# Patient Record
Sex: Male | Born: 1985 | Race: White | Hispanic: No | Marital: Single | Smoking: Never smoker
Health system: Southern US, Community
[De-identification: ages and names within clinical notes are randomized; demographics above are authoritative.]

## PROBLEM LIST (undated history)

## (undated) DIAGNOSIS — I10 Essential (primary) hypertension: Secondary | ICD-10-CM

---

## 2013-05-02 LAB — CBC
MCV: 89 fL (ref 80–100)
RDW: 13.4 % (ref 11.5–14.5)
WBC: 16.3 10*3/uL — ABNORMAL HIGH (ref 3.8–10.6)

## 2013-05-02 LAB — ACETAMINOPHEN LEVEL: Acetaminophen: <2 ug/mL

## 2013-05-02 LAB — COMPREHENSIVE METABOLIC PANEL
Albumin: 4.4 g/dL (ref 3.4–5.0)
Anion Gap: 4 — ABNORMAL LOW (ref 7–16)
Calcium, Total: 9.8 mg/dL (ref 8.5–10.1)
Chloride: 102 mmol/L (ref 98–107)
EGFR (Non-African Amer.): 56 — ABNORMAL LOW
Glucose: 101 mg/dL — ABNORMAL HIGH (ref 65–99)
Osmolality: 274 (ref 275–301)
Potassium: 4.3 mmol/L (ref 3.5–5.1)
SGOT(AST): 30 U/L (ref 15–37)
SGPT (ALT): 29 U/L (ref 12–78)
Sodium: 135 mmol/L — ABNORMAL LOW (ref 136–145)
Total Protein: 8.4 g/dL — ABNORMAL HIGH (ref 6.4–8.2)

## 2013-05-02 LAB — URINALYSIS, COMPLETE
Bilirubin,UR: NEGATIVE
Glucose,UR: NEGATIVE mg/dL (ref 0–75)
Leukocyte Esterase: NEGATIVE
Nitrite: NEGATIVE
Ph: 5 (ref 4.5–8.0)
Protein: NEGATIVE
RBC,UR: 1 /HPF (ref 0–5)

## 2013-05-02 LAB — DRUG SCREEN, URINE
Amphetamines, Ur Screen: NEGATIVE (ref ?–1000)
Barbiturates, Ur Screen: NEGATIVE (ref ?–200)
Benzodiazepine, Ur Scrn: POSITIVE (ref ?–200)
MDMA (Ecstasy)Ur Screen: NEGATIVE (ref ?–500)
Methadone, Ur Screen: NEGATIVE (ref ?–300)
Opiate, Ur Screen: NEGATIVE (ref ?–300)
Tricyclic, Ur Screen: NEGATIVE (ref ?–1000)

## 2013-05-02 LAB — ETHANOL: Ethanol %: <0.003 % (ref 0.000–0.080)

## 2013-05-02 LAB — SALICYLATE LEVEL: Salicylates, Serum: <1.7 mg/dL

## 2013-05-03 ENCOUNTER — Inpatient Hospital Stay: Payer: Self-pay | Admitting: Psychiatry

## 2013-05-03 LAB — BASIC METABOLIC PANEL
Anion Gap: 7 (ref 7–16)
BUN: 20 mg/dL — ABNORMAL HIGH (ref 7–18)
Calcium, Total: 9.6 mg/dL (ref 8.5–10.1)
Co2: 25 mmol/L (ref 21–32)
Creatinine: 1.24 mg/dL (ref 0.60–1.30)
EGFR (Non-African Amer.): >60
Osmolality: 274 (ref 275–301)
Sodium: 135 mmol/L — ABNORMAL LOW (ref 136–145)

## 2013-05-03 LAB — CBC
MCHC: 32.8 g/dL (ref 32.0–36.0)
Platelet: 285 10*3/uL (ref 150–440)
RBC: 5.46 10*6/uL (ref 4.40–5.90)
RDW: 13 % (ref 11.5–14.5)

## 2013-05-04 LAB — BEHAVIORAL MEDICINE 1 PANEL
Albumin: 4.2 g/dL (ref 3.4–5.0)
Alkaline Phosphatase: 81 U/L
Anion Gap: 5 — ABNORMAL LOW (ref 7–16)
BUN: 16 mg/dL (ref 7–18)
Basophil #: 0.1 10*3/uL (ref 0.0–0.1)
Basophil %: 0.4 %
Bilirubin,Total: 0.8 mg/dL (ref 0.2–1.0)
Calcium, Total: 9.7 mg/dL (ref 8.5–10.1)
Chloride: 101 mmol/L (ref 98–107)
Co2: 27 mmol/L (ref 21–32)
Creatinine: 1.32 mg/dL — ABNORMAL HIGH (ref 0.60–1.30)
EGFR (African American): >60
EGFR (Non-African Amer.): >60
Eosinophil #: 0.1 10*3/uL (ref 0.0–0.7)
Eosinophil %: 0.4 %
Glucose: 101 mg/dL — ABNORMAL HIGH (ref 65–99)
HCT: 49.6 % (ref 40.0–52.0)
HGB: 16.5 g/dL (ref 13.0–18.0)
Lymphocyte #: 1.9 10*3/uL (ref 1.0–3.6)
Lymphocyte %: 12.5 %
MCH: 29.7 pg (ref 26.0–34.0)
MCHC: 33.3 g/dL (ref 32.0–36.0)
MCV: 89 fL (ref 80–100)
Monocyte #: 1 x10 3/mm (ref 0.2–1.0)
Monocyte %: 6.4 %
Neutrophil #: 12 10*3/uL — ABNORMAL HIGH (ref 1.4–6.5)
Neutrophil %: 80.3 %
Osmolality: 268 (ref 275–301)
Platelet: 338 10*3/uL (ref 150–440)
Potassium: 3.9 mmol/L (ref 3.5–5.1)
RBC: 5.54 10*6/uL (ref 4.40–5.90)
RDW: 12.9 % (ref 11.5–14.5)
SGOT(AST): 17 U/L (ref 15–37)
SGPT (ALT): 27 U/L (ref 12–78)
Sodium: 133 mmol/L — ABNORMAL LOW (ref 136–145)
Thyroid Stimulating Horm: 2.22 u[IU]/mL
Total Protein: 8.4 g/dL — ABNORMAL HIGH (ref 6.4–8.2)
WBC: 15 10*3/uL — ABNORMAL HIGH (ref 3.8–10.6)

## 2013-05-06 LAB — URINALYSIS, COMPLETE
Bacteria: NONE SEEN
Bilirubin,UR: NEGATIVE
Glucose,UR: NEGATIVE mg/dL (ref 0–75)
Ketone: NEGATIVE
Leukocyte Esterase: NEGATIVE
Nitrite: NEGATIVE
Ph: 6 (ref 4.5–8.0)
Protein: NEGATIVE
RBC,UR: 1 /HPF (ref 0–5)
Specific Gravity: 1.016 (ref 1.003–1.030)
Squamous Epithelial: NONE SEEN
WBC UR: 1 /HPF (ref 0–5)

## 2014-09-06 NOTE — Consult Note (Signed)
PATIENT NAME:  Brent Rios, [name] MR#:  [id number] DATE OF BIRTH:  February 27, 1986  DATE OF CONSULTATION:  05/03/2013  REFERRING PHYSICIAN:  Chaney Mallingavid Yao, MD CONSULTING PHYSICIAN:  Ardeen FillersUzma S. Garnetta BuddyFaheem, MD  REASON FOR CONSULTATION: "I am having suicidal thoughts."   HISTORY OF PRESENT ILLNESS: The patient is a 29 year old white male who was admitted to the ED after he started having suicidal thoughts. He reported that he also quit his job and cannot communicate. During my interview, the patient was sitting quietly on the bed, and he reported that he lost motivation to do things and was pulling away from his spiritual congregation. He reported that he is not too far away to be helped. He stated that he is just ashamed of himself as he started viewing pornography off and on for the past 10 years. He stated that he did not get help in time as he has been watching pornography on the TV, Internet, and on his cell phone. He is currently part of [employer]. He stated that he tried to seek help before, but was never able to conquer his problems. He started watching porn at the age of 29. It go so much that he was out of his spiritual congregation. He recently told his parents and his sister. They were trying to get help for him so he can get through his problems. His father brought him here as he told his mother yesterday that he is going to kill himself. A few days ago he started having suicidal thoughts to escape from this issue. He reported that he was planning to either ingest antifreeze or to put a screwdriver into his head. He reported that his sister gave him some Xanax and as he was unable to sleep. He reported that he stopped eating approximately 2 weeks ago and started losing weight. He has lost significant amount of weight. He was not sleeping as well. He reported that his 2 brothers also have history of anxiety. The patient also lost his job and has been sitting at home and was feeling guilty about the same.   PAST  PSYCHIATRIC HISTORY: The patient reported that he was tried on Celexa in the past, but he only took it for a few days. He is feeling guilty at this time and is having suicidal thoughts.   FAMILY HISTORY: The patient reported his brothers have panic attacks, but they do not take any medications. His parents are [employer].  SOCIAL HISTORY: He lives with his family including his brothers, sister, and parents. He recently quit his job.  He has never married and does not have any children.   ALLERGIES: No known drug allergies.   CURRENT MEDICATIONS: None. No legal pending charges.   VITAL SIGNS: Temperature 98.1, pulse 98, respirations 18, blood pressure 159/96.  LABORATORY DATA: Glucose 127, BUN 20, creatinine 1.24, sodium 135, potassium 3.9, chloride 103, bicarbonate 25, anion gap 7, osmolality 274, calcium 9.6, protein 8.4, albumin 4.4, bilirubin 0.4, alkaline phosphatase 84, AST 30, ALT 29. UDS positive for benzodiazepines. WBC 12, RBC 5.46, hemoglobin 16.1, hematocrit 49.1, MCV 90, and RDW 13.   MENTAL STATUS EXAMINATION: The patient is a moderately built male who appeared apprehensive and anxious during the interview. He maintained poor eye contact. His speech was low in tone and volume. Mood was depressed and anxious. Affect was congruent. Thought process was circumstantial. Thought content was nondelusional. He admits to feeling depressed. He admits to having suicidal ideations with a plan. He was unable to  contract for safety at this time.   DIAGNOSTIC IMPRESSION: AXIS I: 1.  Major depressive disorder, recurrent, severe, without psychotic features.  2.  Anxiety disorder, not otherwise specified.  AXIS II: None.  AXIS III: None.   TREATMENT PLAN:  1.  The patient is currently on involuntary commitment and will be admitted to the inpatient behavioral health unit for stabilization and safety.  2.  He will be started on medications to control his anxiety including Lexapro 10 mg in  the morning and trazodone 100 mg at bedtime. He will be monitored closely by the behavioral health staff and will be placed on suicide precautions. Will obtain collateral information from his family at this time.   Thank you for allowing me to participate in the care of this patient.   ____________________________ Ardeen Fillers. Garnetta Buddy, MD usf:sb D: 05/03/2013 14:30:34 ET T: 05/03/2013 14:47:09 ET JOB#: [id number]  cc: Ardeen Fillers. Garnetta Buddy, MD, <Dictator> Rhunette Croft MD ELECTRONICALLY SIGNED 05/08/2013 14:38
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
## 2014-10-03 ENCOUNTER — Emergency Department (HOSPITAL_COMMUNITY)
Admission: EM | Admit: 2014-10-03 | Discharge: 2014-10-03 | Disposition: A | Discharge status: Other Acute Inpatient Hospital | Payer: Self-pay | Attending: Emergency Medicine | Admitting: Emergency Medicine

## 2014-10-03 ENCOUNTER — Inpatient Hospital Stay (HOSPITAL_COMMUNITY)
Admission: AD | Admit: 2014-10-03 | Discharge: 2014-10-10 | DRG: 885 | Disposition: A | Discharge status: Home / Self Care | Payer: Federal, State, Local not specified - Other | Source: Intra-hospital | Attending: Psychiatry | Admitting: Psychiatry

## 2014-10-03 ENCOUNTER — Encounter (HOSPITAL_COMMUNITY): Payer: Self-pay | Admitting: Emergency Medicine

## 2014-10-03 DIAGNOSIS — F332 Major depressive disorder, recurrent severe without psychotic features: Principal | ICD-10-CM | POA: Diagnosis present

## 2014-10-03 DIAGNOSIS — F411 Generalized anxiety disorder: Secondary | ICD-10-CM | POA: Diagnosis not present

## 2014-10-03 DIAGNOSIS — F419 Anxiety disorder, unspecified: Secondary | ICD-10-CM | POA: Diagnosis present

## 2014-10-03 DIAGNOSIS — R45851 Suicidal ideations: Secondary | ICD-10-CM | POA: Diagnosis present

## 2014-10-03 DIAGNOSIS — G4700 Insomnia, unspecified: Secondary | ICD-10-CM | POA: Diagnosis present

## 2014-10-03 DIAGNOSIS — F329 Major depressive disorder, single episode, unspecified: Secondary | ICD-10-CM | POA: Insufficient documentation

## 2014-10-03 DIAGNOSIS — Z79899 Other long term (current) drug therapy: Secondary | ICD-10-CM | POA: Insufficient documentation

## 2014-10-03 DIAGNOSIS — I10 Essential (primary) hypertension: Secondary | ICD-10-CM | POA: Diagnosis present

## 2014-10-03 HISTORY — DX: Essential (primary) hypertension: I10

## 2014-10-03 LAB — COMPREHENSIVE METABOLIC PANEL
ALK PHOS: 61 U/L (ref 38–126)
ALT: 53 U/L (ref 17–63)
AST: 35 U/L (ref 15–41)
Albumin: 4.6 g/dL (ref 3.5–5.0)
Anion gap: 10 (ref 5–15)
BUN: 16 mg/dL (ref 6–20)
CHLORIDE: 107 mmol/L (ref 101–111)
CO2: 24 mmol/L (ref 22–32)
Calcium: 9.7 mg/dL (ref 8.9–10.3)
Creatinine, Ser: 0.95 mg/dL (ref 0.61–1.24)
GFR calc non Af Amer: >60 mL/min (ref >60)
GLUCOSE: 88 mg/dL (ref 65–99)
Potassium: 3.9 mmol/L (ref 3.5–5.1)
Sodium: 141 mmol/L (ref 135–145)
TOTAL PROTEIN: 8 g/dL (ref 6.5–8.1)
Total Bilirubin: 0.8 mg/dL (ref 0.3–1.2)

## 2014-10-03 LAB — CBC
HCT: 46.7 % (ref 39.0–52.0)
Hemoglobin: 15.6 g/dL (ref 13.0–17.0)
MCH: 30.2 pg (ref 26.0–34.0)
MCHC: 33.4 g/dL (ref 30.0–36.0)
MCV: 90.3 fL (ref 78.0–100.0)
PLATELETS: 336 10*3/uL (ref 150–400)
RBC: 5.17 MIL/uL (ref 4.22–5.81)
RDW: 13.1 % (ref 11.5–15.5)
WBC: 15.5 10*3/uL — AB (ref 4.0–10.5)

## 2014-10-03 LAB — ETHANOL

## 2014-10-03 LAB — RAPID URINE DRUG SCREEN, HOSP PERFORMED
Amphetamines: NOT DETECTED
BENZODIAZEPINES: NOT DETECTED
Barbiturates: NOT DETECTED
Cocaine: NOT DETECTED
Opiates: NOT DETECTED
TETRAHYDROCANNABINOL: NOT DETECTED

## 2014-10-03 LAB — SALICYLATE LEVEL

## 2014-10-03 LAB — ACETAMINOPHEN LEVEL: Acetaminophen (Tylenol), Serum: <10 ug/mL — ABNORMAL LOW (ref 10–30)

## 2014-10-03 NOTE — ED Notes (Signed)
Bed: WA28 Expected date:  Expected time:  Means of arrival:  Comments: Triage 4  

## 2014-10-03 NOTE — ED Provider Notes (Signed)
CSN: [id number]     Arrival date & time 10/03/14  1728 History   First MD Initiated Contact with Patient 10/03/14 1920     Chief Complaint  Patient presents with  . Suicidal     (Consider location/radiation/quality/duration/timing/severity/associated sxs/prior Treatment) HPI Mr. Brent Rios is a 29 y.o male with a history of SI and depression who presents for SI with a plan to hang himself with a belt after arguing with his sister and parents. He states he just had a moment today where he could take it and he trashed his bedroom and left the house. 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 He states he has had SI in the past. He is currently on prozac and states he saw his psych doctor last month.  He denies any HI towards family members or anyone else.  He denies hallucinations.  He denies any alcohol or drug use. He denies any pain.  He denies any fever, chest pain, shortness of breath, nausea, vomiting, diarrhea, or urinary symptoms.  Past Medical History  Diagnosis Date  . Hypertension    History reviewed. No pertinent past surgical history. History reviewed. No pertinent family history. History  Substance Use Topics  . Smoking status: Never Smoker   . Smokeless tobacco: Not on file  . Alcohol Use: Yes     Comment: socially    Review of Systems  All other systems reviewed and are negative.     Allergies  Pollen extract  Home Medications   Prior to Admission medications   Medication Sig Start Date End Date Taking? Authorizing Provider  acetaminophen (TYLENOL) 500 MG tablet Take 1,000 mg by mouth every 6 (six) hours as needed for moderate pain or headache.   Yes Historical Provider, MD  diphenhydrAMINE (BENADRYL) 25 MG tablet Take 25 mg by mouth every 6 (six) hours as needed for allergies.   Yes Historical Provider, MD  FLUoxetine (PROZAC) 40 MG capsule Take 40 mg by mouth daily.   Yes Historical Provider, MD  ibuprofen  (ADVIL,MOTRIN) 200 MG tablet Take 200 mg by mouth every 6 (six) hours as needed for fever or moderate pain.   Yes Historical Provider, MD   BP 150/96 mmHg  Pulse 79  Temp(Src) 98.2 F (36.8 C) (Oral)  Resp 18  SpO2 99% Physical Exam  Constitutional: He is oriented to person, place, and time. He appears well-developed and well-nourished.  HENT:  Head: Normocephalic and atraumatic.  Eyes: Conjunctivae are normal.  Neck: Normal range of motion. Neck supple.  Cardiovascular: Normal rate, regular rhythm and normal heart sounds.   Pulmonary/Chest: Effort normal and breath sounds normal.  Abdominal: Soft. There is no tenderness.  Musculoskeletal: Normal range of motion.  Neurological: He is alert and oriented to person, place, and time.  Skin: Skin is warm and dry.  Psychiatric: He has a normal mood and affect. His speech is normal and behavior is normal.  Nursing note and vitals reviewed.   ED Course  Procedures (including critical care time) Labs Review Labs Reviewed  ACETAMINOPHEN LEVEL - Abnormal; Notable for the following:    Acetaminophen (Tylenol), Serum <10 (*)    All other components within normal limits  CBC - Abnormal; Notable for the following:    WBC 15.5 (*)    All other components within normal limits  COMPREHENSIVE METABOLIC PANEL  ETHANOL  SALICYLATE LEVEL  URINE RAPID DRUG SCREEN (HOSP PERFORMED)  Imaging Review No results found.   EKG Interpretation None      MDM   Final diagnoses:  Suicidal ideation  Patient presents for SI with plan to hang himself with a belt. He states he left the house after trashing his room and did not attempt to go through with his plan.  He denies HI, hallucinations, or drug and alcohol use. He has been seen for this is the past.  He has no pain or complains now. His vitals and labs are unremarkable.  He is medically cleared for treatment.   There is a Comptrollersitter at bedside.      Catha GosselinHanna Patel-Mills, PA-C 10/04/14  1258  Richardean Canalavid H Yao, MD 10/05/14 [phone]

## 2014-10-03 NOTE — Progress Notes (Signed)
[facility], report has been called to the receiving RN, transportation has been paged. Awaiting for pt to be transported to [facility]---------Braeson Rupe, rn

## 2014-10-03 NOTE — BH Assessment (Signed)
Assessment Note  Brent HorsfallSeth Rios is an 29 y.o. male. Who states he presents to [facility] because he was about to hang himself today. The pt stated " I freaked out at home today and I tore up my room. I sat down on the bed and was about to hang myself."  Patient states that he cannot state what specifically triggered him. Patient states that he did not go through with the act of suicide because he has a lot of friends who knows of suicidal people Patient states that he did not want to be a further burden. Therefore, he decided not to commit suicide.    Patient informed CSW that he currently lives in [place] with his mom, dad, and sister. He states that he does not like living at home. He states that his living situation is a big stressor for him. Patient informed CSW that he feels as though he has been having a hard time living with his parents for a few years and that he cannot put up with it any longer.   The pt stated " I dont feel like I get the freedom and respect I deserve." Patient stated to CSW that he feels "smothered". Also, pt states that his brother and sister in law are having marital issues and will be getting a separation. The pt states that this is also a stressor for him because he is close with the two.   Patient admits that he was feeling suicidal today. He states that he is not feeling suicidal at the moment, but says that he does not feel safe going back home. He states it is because he does not know what he will do once he gets home and also because he does not want to go home with his parents.  The pt denies HI and AH/VH. The pt states that he used a belt when he was about to hang himself today. However, he states that he does not have any other weapons at home. 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  The pt states that he was scheduled to have an appointment with his therapist tonight Marvis Repress(Harry Scruggs). He states that he [facility]  [facility]. The pt states that he takes prescribed Prozac.  The pt states that he has insomnia and has been having issues sleeping for about 3 weeks.  CSW spoke with NP regarding disposition: The pt is currently meeting criteria for inpatient placement.  CSW spoke with AC/Joanne who states that the [facility] and his bed number is [id number]. The accepting physician is Dr. Jama Flavorsobos.  Nurse Report Number : [id number]    Axis I: Depressive Disorder NOS Axis II: Deferred Axis III: History reviewed. No pertinent past medical history. Axis IV: housing problems Axis V: 51-60 moderate symptoms  Past Medical History: History reviewed. No pertinent past medical history.  History reviewed. No pertinent past surgical history.  Family History: History reviewed. No pertinent family history.  Social History:  reports that he has never smoked. He does not have any smokeless tobacco history on file. He reports that he drinks alcohol. He reports that he does not use illicit drugs.  Additional Social History:     CIWA: CIWA-Ar BP: 136/91 mmHg Pulse Rate: 113 COWS:    Allergies: No Known Allergies  Home Medications:  (Not in a hospital admission)  OB/GYN Status:  No  LMP for male patient.  General Assessment Data Location of Assessment: [facility] ED Is this a Tele or Face-to-Face Assessment?: Face-to-Face Is this an Initial Assessment or a Re-assessment for this encounter?: Initial Assessment Marital status: Single Is patient pregnant?:  (Male.) Living Arrangements: Parent Can pt return to current living arrangement?: Yes (Pt currently lives at home w/his parents which is a stressor) Admission Status: Voluntary Is patient capable of signing voluntary admission?: Yes Referral Source: Self/Family/Friend     Crisis Care Plan Living Arrangements: Parent  Education Status Is patient currently in school?: No  Risk to self with the past 6  months Suicidal Ideation: Yes-Currently Present Has patient been a risk to self within the past 6 months prior to admission? : Yes Suicidal Intent: Yes-Currently Present Has patient had any suicidal intent within the past 6 months prior to admission? :  (Pt states he was about to hang himself today.) Is patient at risk for suicide?: Yes (Pt states that he does not feel safe returning home.) Suicidal Plan?: Yes-Currently Present Has patient had any suicidal plan within the past 6 months prior to admission? : Yes Specify Current Suicidal Plan:  (Pt states that he was about to hang himself earlier today.) Access to Means: No (Pt used a belt today for indicent. He states no other weapon) What has been your use of drugs/alcohol within the last 12 months?:  (The pt is currently negative in the chart for drugs.) Previous Attempts/Gestures:  (Uknown.) Intentional Self Injurious Behavior: None Family Suicide History: Unknown Recent stressful life event(s): Other (Comment) (Living Situation) Persecutory voices/beliefs?: No Depression: Yes Depression Symptoms: Insomnia Substance abuse history and/or treatment for substance abuse?:  (Uknown) Suicide prevention information given to non-admitted patients: Not applicable (The pt will be going to the observation unit.)  Risk to Others within the past 6 months Homicidal Ideation: No Does patient have any lifetime risk of violence toward others beyond the six months prior to admission? : Unknown Thoughts of Harm to Others: No Current Homicidal Intent: No Current Homicidal Plan: No Access to Homicidal Means: No Identified Victim:  (None. Pt denies being HI.) History of harm to others?: No Assessment of Violence: None Noted Does patient have access to weapons?: No (However, the pt does have a belt.) Criminal Charges Pending?: No Does patient have a court date: No Is patient on probation?: No  Psychosis Hallucinations: None noted Delusions: None  noted  Mental Status Report Appearance/Hygiene: In scrubs Eye Contact: Good Motor Activity: Freedom of movement Speech: Logical/coherent Level of Consciousness: Alert Mood: Other (Comment) (Appropriate.) Affect: Appropriate to circumstance Anxiety Level: Minimal Thought Processes: Coherent, Relevant Judgement: Unimpaired Orientation: Person, Place, Time, Situation, Appropriate for developmental age Obsessive Compulsive Thoughts/Behaviors: None  Cognitive Functioning Concentration: Normal Memory: Recent Intact, Remote Intact IQ: Average Insight: Good Impulse Control: Good Appetite: Good Sleep: Decreased (Pt states that he has had insomnia for about 3 weeks.) Vegetative Symptoms:  (Uknown.)  ADLScreening [facility]) Patient's cognitive ability adequate to safely complete daily activities?: Yes Patient able to express need for assistance with ADLs?: Yes Independently performs ADLs?: Yes (appropriate for developmental age)  Prior Inpatient Therapy Prior Inpatient Therapy: Yes [facility]  Prior Outpatient Therapy Prior Outpatient Therapy: Yes Prior Therapy Facilty/Provider(s):  (Pt informed CSW that his therapist is Marvis RepressHarry Scruggs.) Does patient have an ACCT team?: No Does patient have Intensive In-House Services?  : No Does patient have [facility] services? : No Does patient have P4CC services?: No  ADL Screening (condition at time of admission) Patient's cognitive ability adequate to safely complete daily activities?: Yes Patient able to express need for assistance with ADLs?: Yes Independently performs ADLs?: Yes (appropriate for developmental age)             Advance Directives (For Healthcare) Does patient have an advance directive?: No    Additional Information 1:1 In Past 12 Months?: No CIRT Risk: No Elopement Risk: No Does patient have medical clearance?: No     Disposition:   Disposition Initial Assessment Completed for this Encounter: Yes Disposition of Patient: Inpatient treatment program Type of inpatient treatment program: Adult (The pt will be admitted to [facility].)  On Site Evaluation by:   Reviewed with Physician:    Crista Curb R 10/03/2014 9:05 PM

## 2014-10-03 NOTE — ED Notes (Signed)
Pt states he is suicidal from family stressors.  Pt states that he had a belt around his neck and was going to fasten it to something but decided not to. Denies any HI.  Denies substance abuse.

## 2014-10-04 ENCOUNTER — Encounter (HOSPITAL_COMMUNITY): Payer: Self-pay | Admitting: Behavioral Health

## 2014-10-04 DIAGNOSIS — F332 Major depressive disorder, recurrent severe without psychotic features: Secondary | ICD-10-CM | POA: Diagnosis present

## 2014-10-04 DIAGNOSIS — F411 Generalized anxiety disorder: Secondary | ICD-10-CM

## 2014-10-04 MED ORDER — TRAZODONE HCL 50 MG PO TABS
50.0000 mg | ORAL_TABLET | Freq: Every evening | ORAL | Status: DC | PRN
  Administered 2014-10-04 – 2014-10-09 (×4): 50 mg via ORAL
  Filled 2014-10-04 (×2): qty 1
  Filled 2014-10-04: qty 28
  Filled 2014-10-04 (×3): qty 1

## 2014-10-04 MED ORDER — METOPROLOL TARTRATE 25 MG PO TABS
12.5000 mg | ORAL_TABLET | Freq: Two times a day (BID) | ORAL | Status: DC
  Administered 2014-10-05: 12.5 mg via ORAL
  Filled 2014-10-04: qty 1
  Filled 2014-10-04 (×2): qty 0.5
  Filled 2014-10-04: qty 1

## 2014-10-04 MED ORDER — CLONIDINE HCL 0.1 MG PO TABS
0.1000 mg | ORAL_TABLET | Freq: Once | ORAL | Status: AC
  Administered 2014-10-04: 0.1 mg via ORAL
  Filled 2014-10-04: qty 1

## 2014-10-04 MED ORDER — ALUM & MAG HYDROXIDE-SIMETH 200-200-20 MG/5ML PO SUSP: 30.0000 mL | ORAL | Status: DC | PRN

## 2014-10-04 MED ORDER — IBUPROFEN 600 MG PO TABS
600.0000 mg | ORAL_TABLET | Freq: Four times a day (QID) | ORAL | Status: DC | PRN
  Administered 2014-10-04 – 2014-10-07 (×3): 600 mg via ORAL
  Filled 2014-10-04 (×3): qty 1

## 2014-10-04 MED ORDER — MAGNESIUM HYDROXIDE 400 MG/5ML PO SUSP: 30.0000 mL | Freq: Every day | ORAL | Status: DC | PRN

## 2014-10-04 MED ORDER — LOPERAMIDE HCL 2 MG PO CAPS
2.0000 mg | ORAL_CAPSULE | ORAL | Status: DC | PRN
  Administered 2014-10-04: 2 mg via ORAL
  Filled 2014-10-04 (×2): qty 1

## 2014-10-04 MED ORDER — FLUOXETINE HCL 20 MG PO CAPS
40.0000 mg | ORAL_CAPSULE | Freq: Every day | ORAL | Status: DC
  Administered 2014-10-04: 40 mg via ORAL
  Filled 2014-10-04 (×3): qty 2

## 2014-10-04 MED ORDER — ACETAMINOPHEN 325 MG PO TABS
650.0000 mg | ORAL_TABLET | Freq: Four times a day (QID) | ORAL | Status: DC | PRN
  Administered 2014-10-04 – 2014-10-07 (×5): 650 mg via ORAL
  Filled 2014-10-04 (×5): qty 2

## 2014-10-04 MED ORDER — CITALOPRAM HYDROBROMIDE 20 MG PO TABS
20.0000 mg | ORAL_TABLET | Freq: Every day | ORAL | Status: DC
  Administered 2014-10-05 – 2014-10-10 (×6): 20 mg via ORAL
  Filled 2014-10-04 (×5): qty 1
  Filled 2014-10-04: qty 14
  Filled 2014-10-04 (×2): qty 1

## 2014-10-04 NOTE — H&P (Addendum)
Psychiatric Admission Assessment Adult  Patient Identification: Brent Rios MRN:  [id number] Date of Evaluation:  10/04/2014 Chief Complaint:  DEPRESSIVE DISODER,NOS Principal Diagnosis: Major depressive disorder, recurrent episode, severe Diagnosis:   Patient Active Problem List   Diagnosis Date Noted  . Major depressive disorder, recurrent episode, severe [F33.2] 10/04/2014   History of Present Illness::  Patient is a 29 year old man, who lives with parents and sister. He states he has been feeling more depressed and also experiencing a lot of anxiety. He attributes this to family stressors. These stressors include his parents' health gradually deteriorating, his sister having a serious medical condition ( autoimmune disease ) for which he has taken the role of being her " caregiver", taking her to and from appointments, helping her with daily activities, and more recently his brother separating. Patient states that these stressors have become severely overwhelming for him, and he has developed depression, anxiety, and increased suicidal ideations. States he has a long history of thinking of suicide whenever he feels significantly stressed, and yesterday was thinking of hanging self , but states he refrained " because I did not want it to appear as if it was a case of erotic auto-asphyxiation and humiliate my family".  Due to severe subjective sense of anxiety, he recently left home impulsively and " just walked", eventually requesting to be brought to hospital. Another contributing factor to depression is that he had been off Prozac for several weeks, months, and restarted it only 3 weeks ago or so, but states " it does not seem to be working this time around ".  Elements:   Gradually worsening anxiety and depression , with recent onset of suicidal ideations, in the context of severe psychosocial/family stressors .  Associated Signs/Symptoms: Depression Symptoms:  depressed  mood, anhedonia, insomnia, suicidal thoughts with specific plan, disturbed sleep, erratic appetite, weight stable (Hypo) Manic Symptoms:   Does not currently endorse  Anxiety Symptoms:  Describes severe anxiety, worry  Psychotic Symptoms:  does not endorse and does not appear internally preoccupied  PTSD Symptoms: Does not endorse  Total Time spent with patient: 45 minutes   Past Psychiatric History- Patient states he has had prior episodes of Depression, Anxiety, and frequent suicidal ideations when he feels anxious, depressed, dating back to adolescence. He has had several psychiatric admissions in the past, last one December/14. He states he has attempted suicide several times in the past . Denies history of self cutting. Denies any clear history of mania or hypomania, describes brief mood swings, but emphasizes depression and anxiety as major symptoms. Denies history of psychosis. Denies social phobia, but is " phobic of public bathrooms" leading to being unable to use bathrooms in certain public venues . Had been on Prozac for several months, and states " seemed to work", but was then off for several months after his psychiatrist moved . He restarted Prozac a few weeks ago but does not think it is helping at present .  Past Medical History:  States he was told in the past he could have cutaneous lupus, but states it was never formally diagnosed . Does not smoke . Past Medical History  Diagnosis Date  . Hypertension    History reviewed. No pertinent past surgical history. Family History:  As noted, lives with parents and sister, identifies their medical health/illnesses as a stressor.  Sister and Mother have history of depression and anxiety, and cousin and a nephew have history of Bipolar Disorder. No suicides in family. He states  family member has responded well to Celexa in the past .  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 Denies  legal issues. Financial difficulties are another stressor. Plays guitar and has been wanting to take further music education.  History  Alcohol Use  . Yes    Comment: socially     History  Drug Use No    History   Social History  . Marital Status: Single    Spouse Name: N/A  . Number of Children: N/A  . Years of Education: N/A   Social History Main Topics  . Smoking status: Never Smoker   . Smokeless tobacco: Not on file  . Alcohol Use: Yes     Comment: socially  . Drug Use: No  . Sexual Activity: No   Other Topics Concern  . None   Social History Narrative   Additional Social History:    Pain Medications: see pta med list History of alcohol / drug use?: No history of alcohol / drug abuse   Musculoskeletal: Strength & Muscle Tone: within normal limits Gait & Station: normal Patient leans: N/A  Psychiatric Specialty Exam: Physical Exam  Review of Systems  Constitutional: Negative.   HENT: Negative.   Eyes: Negative.   Respiratory: Negative.   Cardiovascular: Positive for palpitations.       When anxious    Gastrointestinal: Negative.   Genitourinary:       Urinary obstruction when having access only to public restrooms, but states thus far this has not been a problem in hospital setting   Musculoskeletal: Negative.   Skin: Negative.   Neurological: Negative.   Endo/Heme/Allergies: Negative.   Psychiatric/Behavioral: Positive for depression and suicidal ideas. The patient is nervous/anxious.   all other systems negative   Blood pressure 150/100, pulse 87, temperature 97.6 F (36.4 C), temperature source Oral, resp. rate 16, height $RemoveBe'5\' 8"'llduKenoN$  (1.727 m), weight 288 lb (130.636 kg).Body mass index is 43.8 kg/(m^2).  General Appearance: Fairly Groomed  Engineer, water::  Good  Speech:  Normal Rate  Volume:  Normal  Mood:  Anxious and Depressed  Affect:  reactive, smiles appropriately, presents anxious   Thought Process:  Goal Directed and Linear  Orientation:   Full (Time, Place, and Person)  Thought Content:  ruminative about family stressors, denies hallucinations, no delusions  Suicidal Thoughts:  Yes.  without intent/plan- denies any current plan or intention of hurting self on unit and contracts for safety on unit   Homicidal Thoughts:  No  Memory:  Recent and Remote grossly intact  Judgement:  Good  Insight:  Good  Psychomotor Activity:  Normal  Concentration:  Good  Recall:  Good  Fund of Knowledge:Good  Language: Good  Akathisia:  Negative  Handed:  Right  AIMS (if indicated):     Assets:  Desire for Improvement Housing Resilience Vocational/Educational  ADL's:  Impaired  Cognition: WNL  Sleep:  Number of Hours: 4.25   Risk to Self: Is patient at risk for suicide?: Yes What has been your use of drugs/alcohol within the last 12 months?: Patient reports drinking alcohol weekly Risk to Others:   Prior Inpatient Therapy:   Prior Outpatient Therapy:    Alcohol Screening: 1. How often do you have a drink containing alcohol?: Monthly or less 2. How many drinks containing alcohol do you have on a typical day when you are drinking?: 1 or 2 3. How often do you have six or more drinks on  one occasion?: Never Preliminary Score: 0 9. Have you or someone else been injured as a result of your drinking?: No 10. Has a relative or friend or a doctor or another health worker been concerned about your drinking or suggested you cut down?: No Alcohol Use Disorder Identification Test Final Score (AUDIT): 1 Brief Intervention: AUDIT score less than 7 or less-screening does not suggest unhealthy drinking-brief intervention not indicated  Allergies:   Allergies  Allergen Reactions  . Pollen Extract    Lab Results:  Results for orders placed or performed during the hospital encounter of 10/03/14 (from the past 48 hour(s))  Acetaminophen level     Status: Abnormal   Collection Time: 10/03/14  6:29 PM  Result Value Ref Range   Acetaminophen  (Tylenol), Serum <10 (L) 10 - 30 ug/mL    Comment:        THERAPEUTIC CONCENTRATIONS VARY SIGNIFICANTLY. A RANGE OF 10-30 ug/mL MAY BE AN EFFECTIVE CONCENTRATION FOR MANY PATIENTS. HOWEVER, SOME ARE BEST TREATED AT CONCENTRATIONS OUTSIDE THIS RANGE. ACETAMINOPHEN CONCENTRATIONS >150 ug/mL AT 4 HOURS AFTER INGESTION AND >50 ug/mL AT 12 HOURS AFTER INGESTION ARE OFTEN ASSOCIATED WITH TOXIC REACTIONS.   CBC     Status: Abnormal   Collection Time: 10/03/14  6:29 PM  Result Value Ref Range   WBC 15.5 (H) 4.0 - 10.5 K/uL   RBC 5.17 4.22 - 5.81 MIL/uL   Hemoglobin 15.6 13.0 - 17.0 g/dL   HCT 46.7 39.0 - 52.0 %   MCV 90.3 78.0 - 100.0 fL   MCH 30.2 26.0 - 34.0 pg   MCHC 33.4 30.0 - 36.0 g/dL   RDW 13.1 11.5 - 15.5 %   Platelets 336 150 - 400 K/uL  Comprehensive metabolic panel     Status: None   Collection Time: 10/03/14  6:29 PM  Result Value Ref Range   Sodium 141 135 - 145 mmol/L   Potassium 3.9 3.5 - 5.1 mmol/L   Chloride 107 101 - 111 mmol/L   CO2 24 22 - 32 mmol/L   Glucose, Bld 88 65 - 99 mg/dL   BUN 16 6 - 20 mg/dL   Creatinine, Ser 0.95 0.61 - 1.24 mg/dL   Calcium 9.7 8.9 - 10.3 mg/dL   Total Protein 8.0 6.5 - 8.1 g/dL   Albumin 4.6 3.5 - 5.0 g/dL   AST 35 15 - 41 U/L   ALT 53 17 - 63 U/L   Alkaline Phosphatase 61 38 - 126 U/L   Total Bilirubin 0.8 0.3 - 1.2 mg/dL   GFR calc non Af Amer >60 >60 mL/min   GFR calc Af Amer >60 >60 mL/min    Comment: (NOTE) The eGFR has been calculated using the CKD EPI equation. This calculation has not been validated in all clinical situations. eGFR's persistently <60 mL/min signify possible Chronic Kidney Disease.    Anion gap 10 5 - 15  Ethanol (ETOH)     Status: None   Collection Time: 10/03/14  6:29 PM  Result Value Ref Range   Alcohol, Ethyl (B) <5 <5 mg/dL    Comment:        LOWEST DETECTABLE LIMIT FOR SERUM ALCOHOL IS 11 mg/dL FOR MEDICAL PURPOSES ONLY   Salicylate level     Status: None   Collection Time:  10/03/14  6:29 PM  Result Value Ref Range   Salicylate Lvl <4.3 2.8 - 30.0 mg/dL  Urine Drug Screen     Status: None   Collection Time: 10/03/14  7:41 PM  Result Value Ref Range   Opiates NONE DETECTED NONE DETECTED   Cocaine NONE DETECTED NONE DETECTED   Benzodiazepines NONE DETECTED NONE DETECTED   Amphetamines NONE DETECTED NONE DETECTED   Tetrahydrocannabinol NONE DETECTED NONE DETECTED   Barbiturates NONE DETECTED NONE DETECTED    Comment:        DRUG SCREEN FOR MEDICAL PURPOSES ONLY.  IF CONFIRMATION IS NEEDED FOR ANY PURPOSE, NOTIFY LAB WITHIN 5 DAYS.        LOWEST DETECTABLE LIMITS FOR URINE DRUG SCREEN Drug Class       Cutoff (ng/mL) Amphetamine      1000 Barbiturate      200 Benzodiazepine   856 Tricyclics       314 Opiates          300 Cocaine          300 THC              50    Current Medications: Current Facility-Administered Medications  Medication Dose Route Frequency Provider Last Rate Last Dose  . acetaminophen (TYLENOL) tablet 650 mg  650 mg Oral Q6H PRN Harriet Butte, NP   650 mg at 10/04/14 0818  . alum & mag hydroxide-simeth (MAALOX/MYLANTA) 200-200-20 MG/5ML suspension 30 mL  30 mL Oral Q4H PRN Harriet Butte, NP      . FLUoxetine (PROZAC) capsule 40 mg  40 mg Oral Daily Harriet Butte, NP   40 mg at 10/04/14 0818  . ibuprofen (ADVIL,MOTRIN) tablet 600 mg  600 mg Oral Q6H PRN Jenne Campus, MD   600 mg at 10/04/14 1436  . loperamide (IMODIUM) capsule 2 mg  2 mg Oral PRN Jenne Campus, MD   2 mg at 10/04/14 1436  . magnesium hydroxide (MILK OF MAGNESIA) suspension 30 mL  30 mL Oral Daily PRN Harriet Butte, NP      . traZODone (DESYREL) tablet 50 mg  50 mg Oral QHS PRN Harriet Butte, NP       PTA Medications: Prescriptions prior to admission  Medication Sig Dispense Refill Last Dose  . FLUoxetine (PROZAC) 40 MG capsule Take 40 mg by mouth daily.   Past Week at Unknown time  . ibuprofen (ADVIL,MOTRIN) 200 MG tablet Take 200 mg by mouth  every 6 (six) hours as needed for fever or moderate pain.   Past Month at Unknown time  . acetaminophen (TYLENOL) 500 MG tablet Take 1,000 mg by mouth every 6 (six) hours as needed for moderate pain or headache.   Unknown at Unknown time  . diphenhydrAMINE (BENADRYL) 25 MG tablet Take 25 mg by mouth every 6 (six) hours as needed for allergies.   unknown    Previous Psychotropic Medications:  Yes- prozac   Substance Abuse History in the last 12 months:  No. denies drug or alcohol abuse     Consequences of Substance Abuse: Denies   Results for orders placed or performed during the hospital encounter of 10/03/14 (from the past 72 hour(s))  Acetaminophen level     Status: Abnormal   Collection Time: 10/03/14  6:29 PM  Result Value Ref Range   Acetaminophen (Tylenol), Serum <10 (L) 10 - 30 ug/mL    Comment:        THERAPEUTIC CONCENTRATIONS VARY SIGNIFICANTLY. A RANGE OF 10-30 ug/mL MAY BE AN EFFECTIVE CONCENTRATION FOR MANY PATIENTS. HOWEVER, SOME ARE BEST TREATED AT CONCENTRATIONS OUTSIDE THIS RANGE. ACETAMINOPHEN CONCENTRATIONS >150 ug/mL AT 4 HOURS AFTER  INGESTION AND >50 ug/mL AT 12 HOURS AFTER INGESTION ARE OFTEN ASSOCIATED WITH TOXIC REACTIONS.   CBC     Status: Abnormal   Collection Time: 10/03/14  6:29 PM  Result Value Ref Range   WBC 15.5 (H) 4.0 - 10.5 K/uL   RBC 5.17 4.22 - 5.81 MIL/uL   Hemoglobin 15.6 13.0 - 17.0 g/dL   HCT 46.7 39.0 - 52.0 %   MCV 90.3 78.0 - 100.0 fL   MCH 30.2 26.0 - 34.0 pg   MCHC 33.4 30.0 - 36.0 g/dL   RDW 13.1 11.5 - 15.5 %   Platelets 336 150 - 400 K/uL  Comprehensive metabolic panel     Status: None   Collection Time: 10/03/14  6:29 PM  Result Value Ref Range   Sodium 141 135 - 145 mmol/L   Potassium 3.9 3.5 - 5.1 mmol/L   Chloride 107 101 - 111 mmol/L   CO2 24 22 - 32 mmol/L   Glucose, Bld 88 65 - 99 mg/dL   BUN 16 6 - 20 mg/dL   Creatinine, Ser 0.95 0.61 - 1.24 mg/dL   Calcium 9.7 8.9 - 10.3 mg/dL   Total Protein 8.0 6.5 -  8.1 g/dL   Albumin 4.6 3.5 - 5.0 g/dL   AST 35 15 - 41 U/L   ALT 53 17 - 63 U/L   Alkaline Phosphatase 61 38 - 126 U/L   Total Bilirubin 0.8 0.3 - 1.2 mg/dL   GFR calc non Af Amer >60 >60 mL/min   GFR calc Af Amer >60 >60 mL/min    Comment: (NOTE) The eGFR has been calculated using the CKD EPI equation. This calculation has not been validated in all clinical situations. eGFR's persistently <60 mL/min signify possible Chronic Kidney Disease.    Anion gap 10 5 - 15  Ethanol (ETOH)     Status: None   Collection Time: 10/03/14  6:29 PM  Result Value Ref Range   Alcohol, Ethyl (B) <5 <5 mg/dL    Comment:        LOWEST DETECTABLE LIMIT FOR SERUM ALCOHOL IS 11 mg/dL FOR MEDICAL PURPOSES ONLY   Salicylate level     Status: None   Collection Time: 10/03/14  6:29 PM  Result Value Ref Range   Salicylate Lvl <2.6 2.8 - 30.0 mg/dL  Urine Drug Screen     Status: None   Collection Time: 10/03/14  7:41 PM  Result Value Ref Range   Opiates NONE DETECTED NONE DETECTED   Cocaine NONE DETECTED NONE DETECTED   Benzodiazepines NONE DETECTED NONE DETECTED   Amphetamines NONE DETECTED NONE DETECTED   Tetrahydrocannabinol NONE DETECTED NONE DETECTED   Barbiturates NONE DETECTED NONE DETECTED    Comment:        DRUG SCREEN FOR MEDICAL PURPOSES ONLY.  IF CONFIRMATION IS NEEDED FOR ANY PURPOSE, NOTIFY LAB WITHIN 5 DAYS.        LOWEST DETECTABLE LIMITS FOR URINE DRUG SCREEN Drug Class       Cutoff (ng/mL) Amphetamine      1000 Barbiturate      200 Benzodiazepine   948 Tricyclics       546 Opiates          300 Cocaine          300 THC              50     Observation Level/Precautions:  15 minute checks  Laboratory:  Will obtain TSH- states hypothyroidism "  runs in my family"  Psychotherapy:  Supportive, milieu  Medications:  Agrees to [employer] trial- prefers to try new medication than to continue Prozac, which he states no longer working for him. TRAZODONE for insomnia. Side effects  reviewed . Patient's BP is elevated- states it tends to be elevated "at times", and has been on meds for elevated BP in the past, but has not been formally diagnosed with HTN. Will start low dose Lopressor and monitor.   Consultations:  If needed   Discharge Concerns:  Patient states he is reluctant to return home after discharge as he stats it is difficult for him to deal with the stress there .  Estimated LOS: 5- 6 days   Other:     Psychological Evaluations: No   Treatment Plan Summary: Daily contact with patient to assess and evaluate symptoms and progress in treatment, Medication management, Plan inpatient treatment and medications as above   Medical Decision Making:  Review of Psycho-Social Stressors (1), Review or order clinical lab tests (1), Established Problem, Worsening (2) and Review of New Medication or Change in Dosage (2)  I certify that inpatient services furnished can reasonably be expected to improve the patient's condition.   Neita Garnet 5/20/20163:41 PM

## 2014-10-04 NOTE — Tx Team (Signed)
Interdisciplinary Treatment Plan Update (Adult)  Date:  10/04/2014  Time Reviewed:  8:43 AM   Progress in Treatment: Attending groups: Patient is attending groups. Participating in groups:  Patient engages in discussion Taking medication as prescribed:  Patient is taking medications Tolerating medication:  Patient is tolerating medications Family/Significant othe contact made:   No, will asked for consent to make collateral contact Patient understands diagnosis:Yes, patient understands diagnosis and need for treatment Discussing patient identified problems/goals with staff:  Yes, patient is able to express goals/problems Medical problems stabilized or resolved:  Yes Denies suicidal/homicidal ideation: Yes, patient is denying SI/HI. Issues/concerns per patient self-inventory:   Other:   Discharge Plan or Barriers:  Patient will return home and follow up with outpatient providers  Reason for Continuation of Hospitalization: Anxiety Depression Medication stabilization   Comments:  Brent HorsfallSeth Rios is an 29 y.o. male. Who [facility] because he was about to hang himself today. The pt stated " I freaked out at home today and I tore up my room. I sat down on the bed and was about to hang myself." Patient states that he cannot state what specifically triggered him. Patient states that he did not go through with the act of suicide because he has a lot of friends who knows of suicidal people Patient states that he did not want to be a further burden. Therefore, he decided not to commit suicide.    Additional comments:  Patient and CSW reviewed Patient Discharge Process Letter/Patient Involvement Form.  Patient verbalized understanding and signed form.  Patient and CSW also reviewed and identified patient's goals and treatment plan.  Patient verbalized understanding and agreed to plan.  Estimated length of stay: -3-4 days  New goal(s):  Review of initial/current patient goals  per problem list:     Attendees: Patient 10/04/2014 8:43 AM   Family:   10/04/2014 8:43 AM   Physician:  Nehemiah MassedFernando Cobos, MD 10/04/2014 8:43 AM   Nursing:   Haze BoydenAngela Traxler 10/04/2014 8:43 AM   Clinical Social Worker:  Juline PatchQuylle Jaymie Mckiddy, LCSW 10/04/2014 8:43 AM   Clinical Social Worker:  Samuella BruinKristin Drinkard, LCSW-A 10/04/2014 8:43 AM   Case Manager:  Onnie BoerJennifer Clark, RN 10/04/2014 8:43 AM   Other:  [place] CellarLuann Crissman, RN 10/04/2014 8:43 AM  Other:   10/04/2014  8:43 AM   Other:  10/04/2014 8:43 AM   Other:  10/04/2014 8:43 AM   Other:  10/04/2014 8:43 AM   Other:  Chad CordialValerie Enoch, [facility] Transition Team Coordinator 10/04/2014 8:43 AM   Other:   10/04/2014 8:43 AM   Other:  10/04/2014 8:43 AM   Other:   10/04/2014 8:43 AM    Scribe for Treatment Team:   Wynn BankerHodnett, Jahnai Slingerland Hairston, 10/04/2014   8:43 AM

## 2014-10-04 NOTE — Tx Team (Signed)
Initial Interdisciplinary Treatment Plan   PATIENT STRESSORS: Financial difficulties Marital or family conflict Medication change or noncompliance Traumatic event   PATIENT STRENGTHS: Ability for insight Active sense of humor Communication skills General fund of knowledge Motivation for treatment/growth Religious Affiliation Supportive family/friends   PROBLEM LIST: Problem List/Patient Goals Date to be addressed Date deferred Reason deferred Estimated date of resolution  Suicide Risk 10/03/2014     Medication noncompliance 10/03/2014     "I want to focus on myself more." 10/03/2014     "I put the belt around my neck and I thought about tightening it." 10/03/2014     "I am depressed and I have anxiety too." 10/03/2014                              DISCHARGE CRITERIA:  Ability to meet basic life and health needs Adequate post-discharge living arrangements Improved stabilization in mood, thinking, and/or behavior Motivation to continue treatment in a less acute level of care Need for constant or close observation no longer present Reduction of life-threatening or endangering symptoms to within safe limits  PRELIMINARY DISCHARGE PLAN: Attend aftercare/continuing care group Attend PHP/IOP Return to previous living arrangement Return to previous work or school arrangements  PATIENT/FAMIILY INVOLVEMENT: This treatment plan has been presented to and reviewed with the patient, Lutricia HorsfallSeth Hanway.  The patient and family have been given the opportunity to ask questions and make suggestions.  Angeline SlimHill, Ashley M 10/04/2014, 4:24 AM

## 2014-10-04 NOTE — [facility] Group Notes (Signed)
[facility]   10/04/2014 9:39 AM    Participation Quality:  Appropraite  Mood/Affect:  Appropriate  Depression Rating:  5  Anxiety Rating:  7  Thoughts of Suicide:  No  Will you contract for safety?   NA  Current AVH:  No  Plan for Discharge/Comments:  Patient attended discharge planning group and actively participated in group. He advised of being suicidal prior to admission due to family issues.  He reports having outpatient providers and a home.  Suicide prevention education reviewed and SPE document provided.   Transportation Means: Patient has transportation.   Supports:  Patient has a support system.   Brent Rios, [name]

## 2014-10-04 NOTE — Progress Notes (Signed)
Patient c/o diarrhea and stated HA was unrelieved by previous PRN medication.  Will report to provider.

## 2014-10-04 NOTE — [facility] Suicide Risk Assessment (Signed)
[facility] INPATIENT:  Family/Significant Other Suicide Prevention Education  Suicide Prevention Education:  Education Completed; Brent Rios, Sister, [phone]; has been identified by the patient as the family member/significant other with whom the patient will be residing, and identified as the person(s) who will aid the patient in the event of a mental health crisis (suicidal ideations/suicide attempt).  With written consent from the patient, the family member/significant other has been provided the following suicide prevention education, prior to the and/or following the discharge of the patient.  The suicide prevention education provided includes the following:  Suicide risk factors  Suicide prevention and interventions  National Suicide Hotline telephone number  [facility] assessment telephone number  [facility] Assistance 911  [facility] and/or [facility]idential [facility] telephone number  Request made of family/significant other to:  Remove weapons (e.g., guns, rifles, knives), all items previously/currently identified as safety concern.   Sister advised patient does not have access to weapons.    Remove drugs/medications (over-the-counter, prescriptions, illicit drugs), all items previously/currently identified as a safety concern.  The family member/significant other verbalizes understanding of the suicide prevention education information provided.  The family member/significant other agrees to remove the items of safety concern listed above.  Brent Rios, Brent Rios 10/04/2014, 1:07 PM

## 2014-10-04 NOTE — Progress Notes (Signed)
Adult Psychoeducational Group Note  Date:  10/04/2014 Time:  10:42 PM  Group Topic/Focus:  Goals Group:   The focus of this group is to help patients establish daily goals to achieve during treatment and discuss how the patient can incorporate goal setting into their daily lives to aide in recovery.  Participation Level:  Active  Participation Quality:  Appropriate  Affect:  Appropriate  Cognitive:  Appropriate  Insight: Appropriate  Engagement in Group:  Engaged  Modes of Intervention:  Discussion  Additional Comments:  Pt stated that he was happy today because this was the first time in a while he got to open up while he was in group to others he didn't know.  Terie PurserParker, Aneta Hendershott R 10/04/2014, 10:42 PM

## 2014-10-04 NOTE — [facility] Counselor (Signed)
Adult Comprehensive Assessment  Patient ID: Brent HorsfallSeth Rios, male   DOB: 07/31/1985, 29 y.o.   MRN: [id number]  Information Source: Information source: Patient  Current Stressors:  Educational / Learning stressors: None Employment / Job issues: None Family Relationships: Patient advised of several stressors in family due to illness Financial / Lack of resources (include bankruptcy): Struggling financially Housing / Lack of housing: None Physical health (include injuries & life threatening diseases): none Social relationships: None Substance abuse: Patient reports drinking weekly Bereavement / Loss: Aunt died last fall  Living/Environment/Situation:  Living Arrangements: Other relatives Living conditions (as described by patient or guardian): Comfortable How long has patient lived in current situation?: Four years What is atmosphere in current home: Comfortable  Family History:  Marital status: Single Does patient have children?: No  Childhood History:  By whom was/is the patient raised?: Mother Additional childhood history information: Very good chilldhood Description of patient's relationship with caregiver when they were a child: Very good Patient's description of current relationship with people who raised him/her: Good Does patient have siblings?: Yes Number of Siblings: 3 Description of patient's current relationship with siblings: Very close Did patient suffer any verbal/emotional/physical/sexual abuse as a child?: No Did patient suffer from severe childhood neglect?: No Has patient ever been sexually abused/assaulted/raped as an adolescent or adult?: No Was the patient ever a victim of a crime or a disaster?: No Witnessed domestic violence?: No Has patient been effected by domestic violence as an adult?: No  Education:  Highest grade of school patient has completed: Pro[employer], television/film/videoHigh School Currently a student?: No Learning disability?: No  Employment/Work Situation:   Employment  situation: Employed Where is patient 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, water qualitydog sitter How long has patient been employed?: Four months Patient's job has been impacted by current illness: No What is the longest time patient has a held a job?: Four years Where was the patient employed at that time?: [facility] Has patient ever been in the Eli Lilly and Companymilitary?: No Has patient ever served in combat?: No  Financial Resources:   Financial resources: Income from employment Does patient have a representative payee or guardian?: No  Alcohol/Substance Abuse:   What has been your use of drugs/alcohol within the last 12 months?: Patient reports drinking alcohol weekly If attempted suicide, did drugs/alcohol play a role in this?: No Alcohol/Substance Abuse Treatment Hx: Denies past history Has alcohol/substance abuse ever caused legal problems?: No  Social Support System:   Forensic psychologistatient's Community Support System: None Describe Community Support System: N/A Type of faith/religion: Christian How does patient's faith help to cope with current illness?: Prays and reads scriptures  Leisure/Recreation:   Leisure and Hobbies: [place]  Strengths/Needs:   What things does the patient do well?: Capable of independent living  In what areas does patient struggle / problems for patient: Focuses more on others than himself  Discharge Plan:   Does patient have access to transportation?: Yes Will patient be returning to same living situation after discharge?: Yes Currently receiving community mental health services: Yes (From Whom) Vesta Mixer([facility]) If no, would patient like referral for services when discharged?: No Does patient have financial barriers related to discharge medications?: Yes Patient description of barriers related to discharge medications: Limited income and no insurance  Summary/Recommendations:  Brent HorsfallSeth Rios is an 29 y.o. male. [facility] was about to [facility]  himself today. The pt stated " I freaked out at home today and I tore up my room.  I sat down on the bed and was about to hang myself." Patient states that he cannot state what specifically triggered him. Patient states that he did not go through with the act of suicide because he has a lot of friends who knows of suicidal people Patient states that he did not want to be a further burden. Therefore, he decided not to commit suicide.   He will benefit from crisis stabilization, evaluation for medication, psycho-education groups for coping skills development, group therapy and case management for discharge planning.   [name], [name]. 10/04/2014

## 2014-10-04 NOTE — [facility] Suicide Risk Assessment (Signed)
[facility]   Nursing information obtained from:  Patient Demographic factors:  Male, Caucasian Current Mental Status:  Self-harm thoughts, Self-harm behaviors, Suicidal ideation indicated by patient Loss Factors:  Financial problems / change in socioeconomic status Historical Factors:  Prior suicide attempts, Family history of mental illness or substance abuse Risk Reduction Factors:  Positive social support, Sense of responsibility to family, Religious beliefs about death, Employed, Living with another person, especially a relative Total Time spent with patient: 45 minutes Principal Problem: Major depressive disorder, recurrent episode, severe Diagnosis:   Patient Active Problem List   Diagnosis Date Noted  . Major depressive disorder, recurrent episode, severe [F33.2] 10/04/2014     Continued Clinical Symptoms:  Alcohol Use Disorder Identification Test Final Score (AUDIT): 1 The "Alcohol Use Disorders Identification Test", Guidelines for Use in Primary Care, Second Edition.  World Science writerHealth Organization [facility](WHO). Score between 0-7:  no or low risk or alcohol related problems. Score between 8-15:  moderate risk of alcohol related problems. Score between 16-19:  high risk of alcohol related problems. Score 20 or above:  warrants further diagnostic evaluation for alcohol dependence and treatment.   CLINICAL FACTORS:  29 year old single male, lives with parents and sister. History of Depression, Anxiety, and prior suicidal ideations. Reports worsening depression and anxiety in the context of family stressors ( parents' declining health, sister has chronic autoimmune illness, brother recently separated). Recent suicidal thoughts of hanging self . Presents depressed, anxious, but able to contract for safety on the unit at this time.   Musculoskeletal: Strength & Muscle Tone: within normal limits Gait & Station: normal Patient leans: N/A  Psychiatric Specialty  Exam: Physical Exam  ROS  Blood pressure 150/100, pulse 87, temperature 97.6 F (36.4 C), temperature source Oral, resp. rate 16, height 5\' 8"  (1.727 m), weight 288 lb (130.636 kg).Body mass index is 43.8 kg/(m^2).  SEE ADMIT NOTE MSE   COGNITIVE FEATURES THAT CONTRIBUTE TO RISK:  Closed-mindedness    SUICIDE RISK:   Moderate:  Frequent suicidal ideation with limited intensity, and duration, some specificity in terms of plans, no associated intent, good self-control, limited dysphoria/symptomatology, some risk factors present, and identifiable protective factors, including available and accessible social support.  PLAN OF CARE: Patient will be admitted to inpatient psychiatric unit for stabilization and safety. Will provide and encourage milieu participation. Provide medication management and maked adjustments as needed.  Will follow daily.    Medical Decision Making:  Review of Psycho-Social Stressors (1), Review or order clinical lab tests (1), Established Problem, Worsening (2) and Review of New Medication or Change in Dosage (2)  I certify that inpatient services furnished can reasonably be expected to improve the patient's condition.   Brent Rios, Brent Rios 10/04/2014, 4:18 PM

## 2014-10-04 NOTE — Plan of Care (Signed)
Problem: Alteration in mood; excessive anxiety as evidenced by: Goal: STG-Pt can identify coping skills to manage panic/anxiety (Patient can identify at least ____ coping skills to manage panic/anxiety attack)  Outcome: Progressing Group attendance with insight.

## 2014-10-04 NOTE — [facility] Suicide Risk Assessment (Signed)
[facility] INPATIENT:  Family/Significant Other Suicide Prevention Education  Suicide Prevention Education:  Contact Attempts:  Brent LeechLucretia Rios, Sister, [phone];  has been identified by the patient as the family member/significant other with whom the patient will be residing, and identified as the person(s) who will aid the patient in the event of a mental health crisis.  With written consent from the patient, two attempts were made to provide suicide prevention education, prior to and/or following the patient's discharge.  We were unsuccessful in providing suicide prevention education.  A suicide education pamphlet was given to the patient to share with family/significant other.  Date and time of first attempt:  Oct 04, 2014 at 12:45 PM.  Message left for sister to call regarding discharging planning. Date and time of second attempt:  Brent Rios, Brent Rios 10/04/2014, 12:40 PM

## 2014-10-04 NOTE — Progress Notes (Signed)
Recreation Therapy Notes  Date: 10/04/14 Time: 9:30am Location: [address] Dayroom  Group Topic: Stress Management  Goal Area(s) Addresses:  Patient will verbalize importance of using healthy stress management.  Patient will identify positive emotions associated with healthy stress management.   Behavioral Response: Attentive, engaged  Intervention: Guided Imagery Script  Activity :  Patients will listen to LRT read the Guided Imagery Script and follow the prompts to become more relaxed.  Education:  Stress Management, Discharge Planning.   Education Outcome: Acknowledges edcuation/In group clarification offered  Clinical Observations/Feedback: Patient gave full participation.  Patient stated he was feeling anxious and thought he was about to have a panic attack and couldn't focus on what he wanted to ask his social worker about.  Patient stated that the activity helped him calm down, get his thoughts together and focus on what he wanted to talk to his social worker about.  Caroll RancherMarjette Delaina Fetsch, LRT/CTRS   Caroll RancherLindsay, Tyrique Sporn A 10/04/2014 4:36 PM
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
## 2014-10-04 NOTE — [facility] Group Notes (Signed)
[facility] LCSW Group Therapy  Feelings Around Relapse 1:15 -2:30        10/04/2014 3:34 PM   Type of Therapy:  Group Therapy  Participation Level:  Appropriate  Participation Quality:  Appropriate  Affect:  Appropriate  Cognitive:  Attentive Appropriate  Insight:  Developing/Improving  Engagement in Therapy: Developing/Improving  Modes of Intervention:  Discussion Exploration Problem-Solving Supportive  Summary of Progress/Problems:  The topic for today was feelings around relapse. Patient processed feelings toward relapse and was able to relate to peers. He shared his relapse would be pornography.  He shared he has many negative habits and doing one leads to others.  Patient identified coping skills that can be used to prevent a relapse clearing his home of porn material and taking a walk when he feels the urges to look at it.   Wynn BankerHodnett, Denney Shein Hairston 10/04/2014 3:34 PM

## 2014-10-04 NOTE — Progress Notes (Signed)
D-Patient is out in the milieu interacting with peers and attending groups.  He states to this Clinical research associatewriter that "I need to be in the hospital." Reports he has been off of his anti depressant. Denies active suicidal thoughts presently and contracts for safety.  A-Support and encouragement offered.  Continue POC and evaluation of treatment goals.  15' checks for safety.  R- Safety maintained.

## 2014-10-05 LAB — TSH: TSH: 1.488 u[IU]/mL (ref 0.350–4.500)

## 2014-10-05 MED ORDER — DIPHENHYDRAMINE HCL 25 MG PO CAPS
25.0000 mg | ORAL_CAPSULE | Freq: Once | ORAL | Status: AC
  Administered 2014-10-05: 25 mg via ORAL
  Filled 2014-10-05: qty 1

## 2014-10-05 MED ORDER — DIPHENHYDRAMINE HCL 25 MG PO CAPS
ORAL_CAPSULE | ORAL | Status: AC
  Filled 2014-10-05: qty 1

## 2014-10-05 MED ORDER — METOPROLOL TARTRATE 12.5 MG HALF TABLET
12.5000 mg | ORAL_TABLET | Freq: Once | ORAL | Status: AC
  Administered 2014-10-05: 12.5 mg via ORAL
  Filled 2014-10-05: qty 1

## 2014-10-05 MED ORDER — METOPROLOL TARTRATE 25 MG PO TABS
25.0000 mg | ORAL_TABLET | Freq: Two times a day (BID) | ORAL | Status: DC
  Administered 2014-10-05 – 2014-10-08 (×6): 25 mg via ORAL
  Filled 2014-10-05 (×9): qty 1

## 2014-10-05 NOTE — Progress Notes (Signed)
Adult Psychoeducational Group Note  Date:  10/05/2014 Time: 10:00am  Group Topic/Focus:  Making Healthy Choices:   The focus of this group is to help patients identify negative/unhealthy choices they were using prior to admission and identify positive/healthier coping strategies to replace them upon discharge.  Participation Level:  Active  Participation Quality:  Appropriate and Attentive  Affect:  Anxious  Cognitive:  Alert and Appropriate  Insight: Improving  Engagement in Group:  Engaged  Modes of Intervention:  Activity, Discussion, Education, Exploration and Support  Additional Comments:  Pt able to identify three healthy coping skills/choices to make in efforts to prevent future mental health crisis.  [name], Syndi Pua E 10/05/2014, 5:34 PM

## 2014-10-05 NOTE — Progress Notes (Signed)
The focus of this group is to help patients review their daily goal of treatment and discuss progress on daily workbooks. Pt attended the evening group session and responded to all discussion prompts from the Writer. Pt shared that today was a good day on the unit, noting that there were several good groups held today. "I was actually able to share stuff that I've never shared before and that felt good." Pt was given encouragement by the Writer and his peers for being open and forthcoming in his groups. Pt reported having no additional needs from Nursing Staff this evening. Pt's affect was appropriate.

## 2014-10-05 NOTE — Progress Notes (Signed)
D: Pt has anxious affect and mood.  Pt reports he feels "a lot better.  I feel like myself for the first time in a while."  Pt reports his goal today was to "listen and communicate."  Pt reports he is happy because "I've never been able to open up like I did today in group.  Usually I just listen."  Pt reports the highlight of his day was "talking to the doctor."  Pt denies SI/HI, denies hallucinations.  Pt reported passive thoughts of self-harm after evening group.  He denied having a plan and verbally contracted for safety.  Pt has been visible in milieu interacting with peers and staff appropriately.  Pt attended evening group.   A: Introduced self to pt.  Met with pt 1:1 and provided support and encouragement.  PRN medication administered for sleep and pain.  Pt continued to have difficulty sleeping and refused to take repeat dose of Trazodone, stating that the first dose was not helpful.  He reported to Probation officer that he has tried multiple medication for sleep and that he has "struggled with insomnia my whole life."  Pt reported to Probation officer that Benadryl is usually effective for him for sleep.  On-call provider notified and Benadryl 25 mg POX1 ordered and administered.   R: Pt is compliant with medications.  Pt verbally contracts for safety and reports that he will notify staff of needs and concerns.  Will continue to monitor and assess.
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
## 2014-10-05 NOTE — Progress Notes (Signed)
[facility] MD Progress Note  10/05/2014 10:18 AM Brent Rios  MRN:  [id number] Subjective: Awsome! Everything has been Awsome!  I'm really getting a great deal out of the groups because I am more open to what people are saying to me, I have been closed off to this in the past. 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 I've been in a number of these types of places and this really is the best!   Objective:  Met with the patient who is brightly affected, up and active on the unit. 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 He is pleasant and cooperative, but appears almost hypomanic today.  Principal Problem: Major depressive disorder, recurrent episode, severe Diagnosis:   Patient Active Problem List   Diagnosis Date Noted  . Major depressive disorder, recurrent episode, severe [F33.2] 10/04/2014   Total Time spent with patient: 30 minutes   Past Medical History:  Past Medical History  Diagnosis Date  . Hypertension    History reviewed. No pertinent past surgical history. Family History: History reviewed. No pertinent family history. Social History:  History  Alcohol Use  . Yes    Comment: socially     History  Drug Use No    History   Social History  . Marital Status: Single    Spouse Name: N/A  . Number of Children: N/A  . Years of Education: N/A   Social History Main Topics  . Smoking status: Never Smoker   . Smokeless tobacco: Not on file  . Alcohol Use: Yes     Comment: socially  . Drug Use: No  . Sexual Activity: No   Other Topics Concern  . None   Social History Narrative   Additional History:    Sleep: Fair  Appetite:   Fair   Assessment:   Musculoskeletal: Strength & Muscle Tone: within normal limits Gait & Station: normal Patient leans: N/A   Psychiatric Specialty Exam: Physical Exam  Vitals reviewed.   Review of Systems  Gastrointestinal: Positive for diarrhea.  Neurological: Positive for headaches.  All other systems reviewed and are negative.   Blood pressure 140/103, pulse 75, temperature 97.6 F (36.4 C), temperature source Oral, resp. rate 20, height $RemoveBe'5\' 8"'AXkzngfjw$  (1.727 m), weight 130.636 kg (288 lb).Body mass index is 43.8 kg/(m^2).  General Appearance: Fairly Groomed  Engineer, water::  Good  Speech:  pressured  Volume:  Normal  Mood:  Joyful   Affect:  Congruent  Thought Process:  circumstantial  Orientation:  Full (Time, Place, and Person)  Thought Content:  Rumination  Suicidal Thoughts:  No  Homicidal Thoughts:  No  Memory:  Immediate;   Good Recent;   Good Remote;   Good  Judgement:  NA  Insight:  Good  Psychomotor Activity:  Normal  Concentration:  Good  Recall:  Good  Fund of Knowledge:Good  Language: Good  Akathisia:  Negative  Handed:  Right  AIMS (if indicated):     Assets:  Resilience  ADL's:  Intact  Cognition: WNL  Sleep:  "really well"     Current Medications: Current Facility-Administered Medications  Medication Dose Route Frequency Provider Last Rate Last  Dose  . acetaminophen (TYLENOL) tablet 650 mg  650 mg Oral Q6H PRN Harriet Butte, NP   650 mg at 10/05/14 0101  . alum & mag hydroxide-simeth (MAALOX/MYLANTA) 200-200-20 MG/5ML suspension 30 mL  30 mL Oral Q4H PRN Harriet Butte, NP      . citalopram (CELEXA) tablet 20 mg  20 mg Oral Daily Jenne Campus, MD   20 mg at 10/05/14 0744  . ibuprofen (ADVIL,MOTRIN) tablet 600 mg  600 mg Oral Q6H PRN Jenne Campus, MD   600 mg at 10/04/14 1436  . loperamide (IMODIUM) capsule 2 mg  2 mg Oral PRN Jenne Campus, MD   2 mg at 10/04/14 1436  . magnesium hydroxide (MILK OF MAGNESIA) suspension 30 mL  30 mL  Oral Daily PRN Harriet Butte, NP      . metoprolol tartrate (LOPRESSOR) tablet 25 mg  25 mg Oral BID Kerrie Buffalo, NP      . traZODone (DESYREL) tablet 50 mg  50 mg Oral QHS PRN Harriet Butte, NP   50 mg at 10/04/14 2243    Lab Results:  Results for orders placed or performed during the hospital encounter of 10/03/14 (from the past 48 hour(s))  TSH     Status: None   Collection Time: 10/05/14  6:30 AM  Result Value Ref Range   TSH 1.488 0.350 - 4.500 uIU/mL    Comment: Performed at [facility]    Physical Findings: AIMS: Facial and Oral Movements Muscles of Facial Expression: None, normal Lips and Perioral Area: None, normal Jaw: None, normal Tongue: None, normal,Extremity Movements Upper (arms, wrists, hands, fingers): None, normal Lower (legs, knees, ankles, toes): None, normal, Trunk Movements Neck, shoulders, hips: None, normal, Overall Severity Severity of abnormal movements (highest score from questions above): None, normal Incapacitation due to abnormal movements: None, normal, Dental Status Current problems with teeth and/or dentures?: No Does patient usually wear dentures?: No  CIWA:    COWS:     Treatment Plan Summary: Continue crisis management and mood stabilization. Medication management to re-stabilize current mood symptoms Group counseling sessions for coping skills Medical consults as needed.  Increased Metoprolol 25 mg BID.  Will monitor. Review and reinstate any pertinent home medications for other health problems May consider mood stabilizer if symptoms of hypomania worsen.  Marlane Hatcher. [place] RPAC 3:02 PM 10/06/2014 Medical Decision Making:  Review of Psycho-Social Stressors (1), Discuss test with performing physician (1), Review and summation of old records (2) and Review of Medication Regimen & Side Effects (2)  Addendum: RN notes that the patient seems to be "speeding up" as he stated that he feels different." I will order  Seroquel $RemoveB'100mg'biotyywJ$  at hs. For possible mania. Marlane Hatcher. [place] [facility] 10/06/2014 3:45 PM  I reviewed chart and agreed with the findings and treatment Plan.  Berniece Andreas, MD

## 2014-10-05 NOTE — Progress Notes (Signed)
Adult Psychoeducational Group Note  Date:  10/05/2014 Time: 09:30am  Group Topic/Focus:  Crisis Planning:   The purpose of this group is to help patients create a crisis plan for use upon discharge or in the future, as needed.  Participation Level:  Active  Participation Quality:  Attentive  Affect:  Anxious  Cognitive:  Alert and Oriented  Insight: Improving  Engagement in Group:  Engaged and Improving  Modes of Intervention:  Activity, Confrontation, Discussion, Education and Support  Additional Comments:  Pt able to identify aspect of mental health crisis [facility]   [name], Sharlena Kristensen E 10/05/2014, 5:26 PM

## 2014-10-05 NOTE — [facility] Group Notes (Signed)
[facility] Group Notes:  (Clinical Social Work)  10/05/2014     1:15-2:15PM  Summary of Progress/Problems:   The main focus of today's process group was to learn how to use a decisional balance exercise to move forward in the Stages of Change, which were described and discussed.  Motivational Interviewing and a worksheet were utilized to help patients explore in depth the perceived benefits and costs of unhealthy coping techniques, as well as the  benefits and costs of replacing that with a healthy coping skills.   The patient expressed that their unhealthy coping involves relapsing into any of several bad habits, maybe drinking, pills, other.  His healthy coping mechanism involves talking out his problems.  He was an excellent contributor to group, shared his ideas and gave feedback to other patients with appropriate comments.  Type of Therapy:  Group Therapy - Process   Participation Level:  Active  Participation Quality:  Appropriate, Attentive, Sharing and Supportive  Affect:  Appropriate  Cognitive:  Alert, Appropriate and Oriented  Insight:  Engaged  Engagement in Therapy:  Engaged  Modes of Intervention:  Education, Motivational Interviewing  Ambrose MantleMareida Grossman-Orr, LCSW 10/05/2014, 4:56 PM

## 2014-10-05 NOTE — Plan of Care (Signed)
Problem: Diagnosis: Increased Risk For Suicide Attempt Goal: STG-Patient Will Attend All Groups On The Unit Outcome: Progressing Pt attended evening group on 10/04/14.

## 2014-10-06 LAB — SYPHILIS: RPR W/REFLEX TO RPR TITER AND TREPONEMAL ANTIBODIES, TRADITIONAL SCREENING AND DIAGNOSIS ALGORITHM: RPR Ser Ql: NONREACTIVE

## 2014-10-06 MED ORDER — DIPHENHYDRAMINE HCL 25 MG PO CAPS
25.0000 mg | ORAL_CAPSULE | Freq: Once | ORAL | Status: AC
  Administered 2014-10-06: 25 mg via ORAL
  Filled 2014-10-06 (×2): qty 1

## 2014-10-06 MED ORDER — QUETIAPINE FUMARATE 100 MG PO TABS
100.0000 mg | ORAL_TABLET | Freq: Every day | ORAL | Status: DC
  Administered 2014-10-06: 100 mg via ORAL
  Filled 2014-10-06 (×3): qty 1

## 2014-10-06 NOTE — Plan of Care (Signed)
Problem: Alteration in mood Goal: LTG-Patient reports reduction in suicidal thoughts (Patient reports reduction in suicidal thoughts and is able to verbalize a safety plan for whenever patient is feeling suicidal)  Outcome: Progressing Pt denied SI this shift and verbally contracted for safety.       

## 2014-10-06 NOTE — Progress Notes (Signed)
Psychoeducational Group Note  Date:  10/06/2014 Time:  1015  Group Topic/Focus:  Making Healthy Choices:   The focus of this group is to help patients identify negative/unhealthy choices they were using prior to admission and identify positive/healthier coping strategies to replace them upon discharge.  Participation Level:  Active  Participation Quality:  Appropriate  Affect:  Appropriate  Cognitive:  Oriented  Insight:  Improving  Engagement in Group:  Engaged  Additional Comments:  Pt participated in the group, and was able to identify needs that they have. They also were able to identify areas that they needed to grow in.  Sahana Boyland A 10/06/2014 

## 2014-10-06 NOTE — Progress Notes (Signed)
D) Pt has attended the groups and interacts with his peers appropriately. Pt, when free, has been in the dayroom interacting with his peers and playing cards. Pt states he usually does not interact with other patients when he has been in other facilities in the past. States, "I feel such a connectedness with the other patients here and feel that we are all in the same situation struggling to be well. This is a positive experience for me" Pt upbeat as opposed to yesterday when he was not feeling as positive. Pt also Stated this morning "I do not feel at all like myself. I don't know if it the Celexa that is making me feel this way or the fact I was drinking a lot of caffeine. But I just don't feel like myself at all". Pt rates his depression at a 3, his hopelessness at a 1 and his anxiety at a 5. A) Given support, reassurance and praise along with encouragement. Provided with a 1:1. Encouraged to talk with the providers to see if he might need a medication adjustment R) Pt denies SI and HI.

## 2014-10-06 NOTE — [facility] Group Notes (Signed)
[facility] Group Notes:  (Clinical Social Work)  10/06/2014  1:15-2:15PM  Summary of Progress/Problems:   The main focus of today's process group was to   1)  discuss the importance of adding supports  2)  define health supports versus unhealthy supports  3)  identify the patient's current unhealthy supports and plan how to handle them  4)  Identify the patient's current healthy supports and plan what to add.  An emphasis was placed on using counselor, doctor, therapy groups, 12-step groups, and problem-specific support groups to expand supports.    The patient expressed full comprehension of the concepts presented, and agreed that there is a need to add more supports.  The patient stated he has a large number of positive supports, but has to start being a more positive support for himself and allowing himself to reach out to those supports.  Type of Therapy:  Process Group with Motivational Interviewing  Participation Level:  Active  Participation Quality:  Attentive, Sharing and Supportive  Affect:  Appropriate  Cognitive:  Alert, Appropriate and Oriented  Insight:  Engaged  Engagement in Therapy:  Engaged  Modes of Intervention:   Education, Support and Processing, Activity  Ambrose MantleMareida Grossman-Orr, LCSW 10/06/2014

## 2014-10-06 NOTE — Progress Notes (Signed)
D: Pt has anxious affect and mood.  Pt reports his goal today was "to talk, relax, and smile."  Pt reports he went to groups today and "they were great."  Pt denies SI/HI, denies hallucinations, denies pain.  Pt has been visible in milieu interacting with peers and staff appropriately.  Pt attended evening group.   A:  Met with pt 1:1 and provided support and encouragement.  PO fluids encouraged and provided.  PRN medication administered for sleep.   R: Pt is compliant with medications.  Pt verbally contracts for safety.  Will continue to monitor and assess.

## 2014-10-07 LAB — GC/CHLAMYDIA PROBE AMP (~~LOC~~) NOT AT ARMC
Chlamydia: NEGATIVE
Neisseria Gonorrhea: NEGATIVE

## 2014-10-07 MED ORDER — HYDROXYZINE HCL 25 MG PO TABS
25.0000 mg | ORAL_TABLET | Freq: Four times a day (QID) | ORAL | Status: AC | PRN
  Administered 2014-10-07: 25 mg via ORAL
  Filled 2014-10-07: qty 1

## 2014-10-07 MED ORDER — MIRTAZAPINE 15 MG PO TABS
15.0000 mg | ORAL_TABLET | Freq: Every day | ORAL | Status: DC
  Administered 2014-10-07 – 2014-10-09 (×3): 15 mg via ORAL
  Filled 2014-10-07: qty 14
  Filled 2014-10-07 (×4): qty 1

## 2014-10-07 MED ORDER — GABAPENTIN 100 MG PO CAPS
100.0000 mg | ORAL_CAPSULE | Freq: Three times a day (TID) | ORAL | Status: DC
  Administered 2014-10-07 – 2014-10-08 (×4): 100 mg via ORAL
  Filled 2014-10-07 (×6): qty 1

## 2014-10-07 MED ORDER — LORATADINE 10 MG PO TABS
10.0000 mg | ORAL_TABLET | Freq: Every day | ORAL | Status: DC
  Administered 2014-10-08 – 2014-10-10 (×3): 10 mg via ORAL
  Filled 2014-10-07: qty 1
  Filled 2014-10-07: qty 14
  Filled 2014-10-07 (×3): qty 1

## 2014-10-07 NOTE — Progress Notes (Signed)
D: Pt presents anxious on approach. Pt denied depression and SI/HI during shift assessment. Pt rated depression 1/10. Anxiety 2/10. Pt c/o ongoing headache. Pt reported to another nurse this morning that he was having thoughts to destroy the room but he came to staff instead of acting out on those thoughts. Pt stated that he often have headaches and that triggers his neg thoughts. Pt offered pain meds for headache. Writer spoke with MD in regards to pt anxiety.  Pt hygiene appropriate for situation. Pt compliant with treatment.  A: Medications administered as ordered per MD. Verbal support given. Pt encouraged to attend groups. 15 minute checks performed for safety.  R: Pt receptive to treatment.

## 2014-10-07 NOTE — Progress Notes (Signed)
Patient ID: Brent Rios, male   DOB: 05-28-1985, 29 y.o.   MRN: [id number] [facility] MD Progress Note  10/07/2014 1:55 PM Senay Sistrunk  MRN:  [id number] Subjective:  Patient reports he is feeling more anxious today, and continues to ruminate about family stressors. Today states he has been thinking about his sister's chronic illness, and thinking that she may die in the relative near future. He also states he has been worrying about a niece who has chronic anxiety and depression as well. States " on the one hand I want to go home, but on the other, I don't feel ready yet- I am still too anxious". Of note, patient had initially stated upon admission that he preferred not to go back home after discharge, but today states that he has reconsidered this and states " when I leave I'll go back home- we are very close and I want to see them". Denies medication side effects.   Objective:   I have discussed case with treatment team and have met with patient. As reviewed , patient's presentation has been variable, ranging from stating that he feels much better and " awesome" to describing significant anxiety. Today he states he certainly feels more anxious - he attributes this  In part to group earlier today which focused on discharge planning, and where he " realized I was not ready to go yet". He also states he had been experiencing a headache, which he feels worsened his anxiety. States that headaches/ pain are intermittent but does worsen his anxiety.  He has been going to groups and there have been no disruptive behaviors on unit.  Currently denies any medication side effects. Recently started on Seroquel QHS - we discussed side effect profile. We discussed other options, and patient was amenable to neurontin, to address pain and decrease anxiety, and to remeron, for sleep and to help address depression and anxiety. Patient has a history of having difficulty urinating  In bathrooms outside his home, but states this has  not been an issue here on unit. He responds to support , empathy, review of medications , treatment goals.    Principal Problem: Major depressive disorder, recurrent episode, severe Diagnosis:   Patient Active Problem List   Diagnosis Date Noted  . Major depressive disorder, recurrent episode, severe [F33.2] 10/04/2014   Total Time spent with patient: 30 minutes   Past Medical History:  Past Medical History  Diagnosis Date  . Hypertension    History reviewed. No pertinent past surgical history. Family History: History reviewed. No pertinent family history. Social History:  History  Alcohol Use  . Yes    Comment: socially     History  Drug Use No    History   Social History  . Marital Status: Single    Spouse Name: N/A  . Number of Children: N/A  . Years of Education: N/A   Social History Main Topics  . Smoking status: Never Smoker   . Smokeless tobacco: Not on file  . Alcohol Use: Yes     Comment: socially  . Drug Use: No  . Sexual Activity: No   Other Topics Concern  . None   Social History Narrative   Additional History:    Sleep:  Variable   Appetite:  Good    Assessment:   Musculoskeletal: Strength & Muscle Tone: within normal limits Gait & Station: normal Patient leans: N/A   Psychiatric Specialty Exam: Physical Exam  Vitals reviewed.   ROS- no nausea, no vomiting, (+)  headaches , which are intermittent   Blood pressure 126/95, pulse 68, temperature 97.5 F (36.4 C), temperature source Oral, resp. rate 18, height $RemoveBe'5\' 8"'laGBpoWoa$  (1.727 m), weight 288 lb (130.636 kg).Body mass index is 43.8 kg/(m^2).  General Appearance: improved grooming  Eye Contact::  Good  Speech:  Normal   Volume:  Normal  Mood:   States today " anxious"   Affect:  Congruent  Thought Process:  Linear   Orientation:  Full (Time, Place, and Person)  Thought Content:  Ruminative about family stressors, no hallucinations, no delusions   Suicidal Thoughts:  No- at this time  denies any thoughts of hurting self or anyone else   Homicidal Thoughts:  No  Memory:  Immediate;   Good Recent;   Good Remote;   Good  Judgement:  NA  Insight:  Good  Psychomotor Activity:  Normal  Concentration:  Good  Recall:  Good  Fund of Knowledge:Good  Language: Good  Akathisia:  Negative  Handed:  Right  AIMS (if indicated):     Assets:  Resilience  ADL's:  Intact  Cognition: WNL  Sleep:  "really well"     Current Medications: Current Facility-Administered Medications  Medication Dose Route Frequency Provider Last Rate Last Dose  . acetaminophen (TYLENOL) tablet 650 mg  650 mg Oral Q6H PRN Harriet Butte, NP   650 mg at 10/07/14 0759  . alum & mag hydroxide-simeth (MAALOX/MYLANTA) 200-200-20 MG/5ML suspension 30 mL  30 mL Oral Q4H PRN Harriet Butte, NP      . citalopram (CELEXA) tablet 20 mg  20 mg Oral Daily Jenne Campus, MD   20 mg at 10/07/14 0759  . gabapentin (NEURONTIN) capsule 100 mg  100 mg Oral TID Jenne Campus, MD      . ibuprofen (ADVIL,MOTRIN) tablet 600 mg  600 mg Oral Q6H PRN Jenne Campus, MD   600 mg at 10/05/14 1208  . loperamide (IMODIUM) capsule 2 mg  2 mg Oral PRN Jenne Campus, MD   2 mg at 10/04/14 1436  . magnesium hydroxide (MILK OF MAGNESIA) suspension 30 mL  30 mL Oral Daily PRN Harriet Butte, NP      . metoprolol tartrate (LOPRESSOR) tablet 25 mg  25 mg Oral BID Kerrie Buffalo, NP   25 mg at 10/07/14 0759  . mirtazapine (REMERON) tablet 15 mg  15 mg Oral QHS Jenne Campus, MD      . traZODone (DESYREL) tablet 50 mg  50 mg Oral QHS PRN Harriet Butte, NP   50 mg at 10/05/14 2204    Lab Results:  Results for orders placed or performed during the hospital encounter of 10/03/14 (from the past 48 hour(s))  RPR     Status: None   Collection Time: 10/05/14  7:16 PM  Result Value Ref Range   RPR Ser Ql Non Reactive Non Reactive    Comment: (NOTE) Performed At: [facility] [place], [place]  [id number] Lindon Romp MD RR:[id number] Performed at [facility]     Physical Findings: AIMS: Facial and Oral Movements Muscles of Facial Expression: None, normal Lips and Perioral Area: None, normal Jaw: None, normal Tongue: None, normal,Extremity Movements Upper (arms, wrists, hands, fingers): None, normal Lower (legs, knees, ankles, toes): None, normal, Trunk Movements Neck, shoulders, hips: None, normal, Overall Severity Severity of abnormal movements (highest score from questions above): None, normal Incapacitation due to abnormal movements: None, normal Patient's awareness of  abnormal movements (rate only patient's report): No Awareness, Dental Status Current problems with teeth and/or dentures?: No Does patient usually wear dentures?: No  CIWA:    COWS:      Assessment/ Plan- patient's presentation variable- over weekend was brighter in affect, feeling improved, but today , related to group focusing on discharge plans, felt acutely anxious . At this time minimizing depression, but describing significant anxiety. Denies medication side effects. He is also reporting history of intermittent headaches, contributing to anxiety.  We reviewed history- denies any clear history of Mania /Bipolarity, stresses anxiety as main symptom at this time and is not currently  Presenting with symptoms of mania/hypomania .  We discussed options- D/C Seroquel, start Neurontin 100 mgrs TID initially to address anxiety, pain, and start Remeron 15 mgrs QHS for sleep and depression/anxiety.  Of note, at this time patient wants to return home after discharge .  Treatment Plan Summary: Continue inpatient treatment. Start Neurontin 100 mgrs TID for anxiety/pain - side effects discussed. Start Remeron 15 mgrs QHS for sleep, anxiety, depression- side effects discussed  Continue Celexa 20 mgrs QDAY for anxiety, depression. D/C Seroquel Continue Metoprolol 25 mg BID for Blood Pressure  management. May also help decrease anxiety.   Neita Garnet , MD 1:55 PM 10/07/2014 Medical Decision Making:  Established Problem, Stable/Improving (1), Review of Psycho-Social Stressors (1), Review or order clinical lab tests (1), Review of Medication Regimen & Side Effects (2) and Review of New Medication or Change in Dosage (2)

## 2014-10-07 NOTE — [facility] Group Notes (Signed)
[facility]   10/07/2014 12:23 PM    Participation Quality:  Appropraite  Mood/Affect:  Appropriate  Depression Rating:  5  Anxiety Rating:  6  Thoughts of Suicide:  No  Will you contract for safety?   NA  Current AVH:  No  Plan for Discharge/Comments:  Patient attended discharge planning group and actively participated in group. He will return home with family and [facility].Suicide prevention education reviewed and SPE document provided.   Transportation Means: Patient has transportation.   Supports:  Patient has a support system.   Brent Rios, Brent Rios

## 2014-10-07 NOTE — Progress Notes (Signed)
D: Pt has anxious affect and depressed, anxious mood.  Pt reports "I was very anxious today, I woke up anxious."  Pt reports he had a good visit with "2 of my minister friends" and that it was "well needed."  Pt reports he feel less anxious tonight.  Pt reports he was having thoughts of self-harm earlier today, denies tonight.  Pt denies SI/HI, denies hallucinations.  Pt reports "I slept well last night."  Pt has been visible in milieu interacting with peers and staff appropriately.  Pt attended evening group.   A:  Met with pt 1:1 and provided support and encouragement.  Actively listened to pt.  Pt requested Benadryl reporting he was having problems with his allergies because he went outside today.  On-call provider notified and Benadryl 25 mg POX1 ordered and administered. Medications administered per order.  PRN medication administered for pain.  PO fluids encouraged and provided. R: Pt is compliant with medications.  Pt verbally contracts for safety.  Will continue to monitor and assess.

## 2014-10-07 NOTE — Progress Notes (Signed)
Recreation Therapy Notes  Date: 10/07/14 Time: 9:30am Location: [address] Dayroom  Group Topic: Stress Management  Goal Area(s) Addresses:  Patient will verbalize importance of using healthy stress management.  Patient will identify positive emotions associated with healthy stress management.   Intervention: Stress Management  Activity :  Guided Imagery.  LRT introduced and educated patients on stress management technique of guided imagery.  Scripts were used to deliver the technique to patients, patients were asked to follow script read allowed by LRT to engage in practicing the stress management technique.  Education:  Stress Management, Discharge Planning.   Education Outcome: Needs additional education  Clinical Observations/Feedback:  Patient did not attend.   Gaetan Spieker, LRT/CTRS         Ashauna Bertholf A 10/07/2014 3:47 PM 

## 2014-10-07 NOTE — Plan of Care (Signed)
Problem: Diagnosis: Increased Risk For Suicide Attempt Goal: STG-Patient Will Comply With Medication Regime Outcome: Progressing Pt has been compliant with all medications this shift.       

## 2014-10-07 NOTE — Plan of Care (Signed)
Problem: Ineffective individual coping Goal: STG-Increase in ability to manage activities of daily living Outcome: Progressing ADL completed today

## 2014-10-07 NOTE — [facility] Group Notes (Signed)
[facility] LCSW Group Therapy          Overcoming Obstacles       1:15 -2:30        10/07/2014       Type of Therapy:  Group Therapy  Participation Level:  Appropriate  Participation Quality:  Appropriate  Affect:  Appropriate  Cognitive:  Attentive Appropriate  Insight: Developing/Improving Engaged  Engagement in Therapy: Developing/Imprvoing Engaged  Modes of Intervention:  Discussion Exploration  Education Rapport BuildingProblem-Solving Support  Summary of Progress/Problems:  The main focus of today's group was overcoming obstacles. He shared the obstacle he has to overcome is his family.  Patient able to identify appropriate coping skills.   Wynn BankerHodnett, Sheyna Pettibone Hairston 10/07/2014

## 2014-10-08 MED ORDER — LORAZEPAM 0.5 MG PO TABS
0.5000 mg | ORAL_TABLET | Freq: Four times a day (QID) | ORAL | Status: DC | PRN
  Administered 2014-10-08 – 2014-10-09 (×2): 0.5 mg via ORAL
  Filled 2014-10-08 (×2): qty 1

## 2014-10-08 MED ORDER — METOPROLOL TARTRATE 50 MG PO TABS
50.0000 mg | ORAL_TABLET | Freq: Two times a day (BID) | ORAL | Status: DC
  Administered 2014-10-08 – 2014-10-10 (×4): 50 mg via ORAL
  Filled 2014-10-08 (×2): qty 1
  Filled 2014-10-08: qty 28
  Filled 2014-10-08 (×3): qty 1
  Filled 2014-10-08: qty 28
  Filled 2014-10-08: qty 1

## 2014-10-08 MED ORDER — GABAPENTIN 100 MG PO CAPS
200.0000 mg | ORAL_CAPSULE | Freq: Two times a day (BID) | ORAL | Status: DC
  Administered 2014-10-09: 200 mg via ORAL
  Filled 2014-10-08 (×5): qty 2

## 2014-10-08 MED ORDER — HYDROCHLOROTHIAZIDE 12.5 MG PO CAPS
12.5000 mg | ORAL_CAPSULE | Freq: Every day | ORAL | Status: DC
  Administered 2014-10-08 – 2014-10-10 (×3): 12.5 mg via ORAL
  Filled 2014-10-08 (×6): qty 1

## 2014-10-08 NOTE — Tx Team (Signed)
Interdisciplinary Treatment Plan Update (Adult)  Date:  10/08/2014  Time Reviewed:  9:30 AM   Progress in Treatment: Attending groups: Patient is attending groups. Participating in groups:  Patient engages in discussion Taking medication as prescribed:  Patient is taking medications Tolerating medication:  Patient is tolerating medications Family/Significant othe contact made:   Yes, CSW has spoken with patient's sister Patient understands diagnosis:Yes, patient understands diagnosis and need for treatment Discussing patient identified problems/goals with staff:  Yes, patient is able to express goals/problems Medical problems stabilized or resolved:  Yes Denies suicidal/homicidal ideation: Yes, patient is denying SI/HI. Issues/concerns per patient self-inventory:   Other:   Discharge Plan or Barriers:   5/24: Patient will return home and follow up with outpatient providers  Reason for Continuation of Hospitalization: Anxiety Depression Medication stabilization   Comments:  Brent Rios is an 29 y.o. male. [facility] today. The pt stated " I freaked out at home today and I tore up my room. I sat down on the bed and was about to hang myself." Patient states that he cannot state what specifically triggered him. Patient states that he did not go through with the act of suicide because he has a lot of friends who knows of suicidal people Patient states that he did not want to be a further burden. Therefore, he decided not to commit suicide.    Additional comments:  Patient and CSW reviewed Patient Discharge Process Letter/Patient Involvement Form.  Patient verbalized understanding and signed form.  Patient and CSW also reviewed and identified patient's goals and treatment plan.  Patient verbalized understanding and agreed to plan.  Estimated length of stay: 1-2 days   Review of initial/current patient goals per problem list:    Brent Rios is an 29 y.o. male. [facility]. The pt stated " I freaked out at home today and I tore up my room. I sat down on the bed and was about to hang myself." Patient states that he cannot state what specifically triggered him. Patient states that he did not go through with the act of suicide because he has a lot of friends who knows of suicidal people Patient states that he did not want to be a further burden. Therefore, he decided not to commit suicide.   He will benefit from crisis stabilization, evaluation for medication, psycho-education groups for coping skills development, group therapy and case management for discharge planning.   Attendees: Patient:    Family:    Physician: Dr. Jama Flavorsobos; Dr. Dub MikesLugo 10/08/2014 9:30 AM  Nursing: Horton ChinAndrea Thorpe, Kathi SimpersSarah Twyman, RN 10/08/2014 9:30 AM  Clinical Social Worker: Samuella BruinKristin Woodfin Kiss,  LCSWA 10/08/2014 9:30 AM  Other: Chad CordialLauren Carter, LCSWA  10/08/2014 9:30 AM  Other: Leisa LenzValerie Enoch, Vesta MixerMonarch Liaison 10/08/2014 9:30 AM  Other: Onnie BoerJennifer Clark, Case Manager 10/08/2014 9:30 AM  Other: Serena ColonelAggie Nwoko, May Augustin, NP 10/08/2014 9:30 AM  Other:    Other:    Other:    Other:    Other:     Samuella BruinKristin Nanna Ertle, MSW, LCSWA Clinical Social Worker [facility] [phone]

## 2014-10-08 NOTE — Progress Notes (Signed)
Patient ID: Brent Rios, male   DOB: 10/11/1985, 29 y.o.   MRN: [id number]   Pt currently presents with an animated affect and anxious behavior. Per self inventory, pt rates depression at a 1, hopelessness 2 and anxiety 3. Pt's daily goal is "relaxing" and they intend to do so by "listen." Pt reports good sleep, good concentration and a fair appetite. Pt reports back pain throughout the day.   Pt provided with medications and offered prn medications per providers orders. Pt's labs and vitals were monitored throughout the day. Pt supported emotionally and encouraged to express concerns and questions. Pt educated on medications and relaxation techniques. Pt given a 1:1.   Pt's safety ensured with 15 minute and environmental checks. Pt currently denies SI/HI and Visual hallucinations. Pt endorses some AH stating, "I thought I heard a phone ringing earlier and yesterday I heard a man on a walky-talky asking for Brent HiddenGary." Pt denies command AH or any disturbance caused by AH. Pt verbally agrees to seek staff if SI/HI or VH occurs, if AH frightens or worsens and to consult with staff before acting on harmful thoughts. Pt refused pain medications throughout the day.   Pt maintains that his family is his main stressor. Pt reports running away to try to get back to [place] grew up. Pt states "I am not emotionally or physically ready to live with them or see them." Pt requests that he has no visitors today. Charge consulted and secretaries notified. To be reassessed tomorrow.

## 2014-10-08 NOTE — Progress Notes (Signed)
D Pt. Denies SI and HI, no complaints of pain or discomfort noted at present time.  A Writer offered support and encouragement, discussed coping skills with pt.  R Patient rates his day a 6, his depression a 1 and anxiety a 3.  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  Pt. Also reports he talked to his family today.  Pt. Remains safe on the unit.

## 2014-10-08 NOTE — Progress Notes (Signed)
Recreation Therapy Notes  Animal-Assisted Activity (AAA) Program Checklist/Progress Notes Patient Eligibility Criteria Checklist & Daily Group note for Rec Tx Intervention  Date: 10/08/14 Time: 2:30pm Location: [address] Dayroom   AAA/T Program Assumption of Risk Form signed by Patient/ or Parent Legal Guardian yes  Patient is free of allergies or sever asthma yes  Patient reports no fear of animals yes  Patient reports no history of cruelty to animalsyes  Patient understands his/her participation is voluntary yes  Patient washes hands before animal contact yes  Patient washes hands after animal contact yes  Education: Hand Washing, Appropriate Animal Interaction   Education Outcome: Acknowledges understanding/In group clarification offered/Needs additional education.   Clinical Observations/Feedback: Patient did not attend group.   Brent Rios, LRT/CTRS         Brent Rios 10/08/2014 4:31 PM 

## 2014-10-08 NOTE — [facility] Group Notes (Signed)
[facility] LCSW Group Therapy  10/08/2014   1:15 PM   Type of Therapy:  Group Therapy  Participation Level:  Active  Participation Quality:  Attentive, Sharing and Supportive  Affect:  Appropriate  Cognitive:  Alert and Oriented  Insight:  Developing/Improving and Engaged  Engagement in Therapy:  Developing/Improving and Engaged  Modes of Intervention:  Clarification, Confrontation, Discussion, Education, Exploration, Limit-setting, Orientation, Problem-solving, Rapport Building, Dance movement psychotherapisteality Testing, Socialization and Support  Summary of Progress/Problems: The topic for group therapy was feelings about diagnosis.  Pt actively participated in group discussion on their past and current diagnosis and how they feel towards this.  Pt also identified how society and family members judge them, based on their diagnosis as well as stereotypes and stigmas.  Patient discussed experiencing several recent losses in his life which triggered his current behavioral crisis. Patient explained that he has been dealing with depression and SI since the age of 29 and feels that his parents are in denial about his illness. Patient also discussed taking on responsibilities and problems of other relatives, CSW emphasized the importance of setting boundaries with family for one's own wellbeing. CSW and other group members provided patient with emotional support and encouragement.  Samuella BruinKristin Amena Dockham, MSW, [employer]CSWA Clinical Social Worker [facility] [phone]

## 2014-10-08 NOTE — Progress Notes (Signed)
Adult Psychoeducational Group Note  Date:  10/08/2014 Time:  0900  Group Topic/Focus:  Orientation:   The focus of this group is to educate the patient on the purpose and policies of crisis stabilization and provide a format to answer questions about their admission.  The group details unit policies and expectations of patients while admitted.  Participation Level:  Active  Participation Quality:  Appropriate  Affect:  Appropriate  Cognitive:  Appropriate  Insight: Appropriate  Engagement in Group:  Engaged  Modes of Intervention:  Education  Additional Comments:    [name] L 10/08/2014, 9:58 AM

## 2014-10-08 NOTE — Progress Notes (Signed)
Patient ID: Brent Rios, male   DOB: February 03, 1986, 29 y.o.   MRN: [id number] [facility] MD Progress Note  10/08/2014 3:05 PM Brent Rios  MRN:  [id number] Subjective:  Patient reports significant anxiety today. States " yesterday I was feeling I would be ready for discharge home today, but today I don't feel ready".  He states that  Thinking of discharge /family stressors, and being in a group earlier today where there was some tension between other participants triggered increased anxiety. Denies medication side effects.   Objective:    I have discussed case with treatment team and have met with patient. As described by staff, patient has been active in groups, present in milieu. No disruptive behaviors . Patient states that overall his mood is better but that he is feeling anxious. He does  present more anxious today, with slight psychomotor restlessness. States " when I feel like this I feel like I need to punch the wall or scream or something, but I don't do it ".  He cannot identify any clear triggers, but did feel uncomfortable during a recent group where he sensed there was tension between other peers. Also, continues to ruminate about his family dynamics and sister's chronic illness . Describes symptoms suggestive of incipient panic attack, but did seem to improve with partial decrease of   Anxiety during session.  Of note, patient's BP has been consistently elevated- he states he has not been diagnosed with HTN in the past, but that it has been elevated before. Tolerating Metoprolol well but without appreciable decrease in BP.  I have reviewed with hospitalist consult and mediation adjustments suggested , implemented. Patient in agreement.  Denies medication side effects.       Principal Problem: Major depressive disorder, recurrent episode, severe Diagnosis:   Patient Active Problem List   Diagnosis Date Noted  . Major depressive disorder, recurrent episode, severe [F33.2] 10/04/2014   Total  Time spent with patient: 25 minutes    Past Medical History:  Past Medical History  Diagnosis Date  . Hypertension    History reviewed. No pertinent past surgical history. Family History: History reviewed. No pertinent family history. Social History:  History  Alcohol Use  . Yes    Comment: socially     History  Drug Use No    History   Social History  . Marital Status: Single    Spouse Name: N/A  . Number of Children: N/A  . Years of Education: N/A   Social History Main Topics  . Smoking status: Never Smoker   . Smokeless tobacco: Not on file  . Alcohol Use: Yes     Comment: socially  . Drug Use: No  . Sexual Activity: No   Other Topics Concern  . None   Social History Narrative   Additional History:    Sleep:  Variable   Appetite:  Good    Assessment:   Musculoskeletal: Strength & Muscle Tone: within normal limits Gait & Station: normal Patient leans: N/A   Psychiatric Specialty Exam: Physical Exam  Vitals reviewed.   ROS- no nausea, no vomiting, (+) headaches , which are intermittent   Blood pressure 142/105, pulse 76, temperature 97.8 F (36.6 C), temperature source Oral, resp. rate 15, height $RemoveBe'5\' 8"'SsfAaLeeI$  (1.727 m), weight 288 lb (130.636 kg).Body mass index is 43.8 kg/(m^2).  General Appearance: improved grooming  Eye Contact::  Good  Speech:  Normal   Volume:  Normal  Mood:   Mood improved , significantly anxious today  Affect:  Anxious   Thought Process:  Linear   Orientation:  Full (Time, Place, and Person)  Thought Content:  Ruminative about family stressors, no hallucinations, no delusions   Suicidal Thoughts:  No- at this time denies any thoughts of hurting self or anyone else   Homicidal Thoughts:  No  Memory:  Immediate;   Good Recent;   Good Remote;   Good  Judgement:  NA  Insight:  Good  Psychomotor Activity:  Mild degree of restlessness   Concentration:  Good  Recall:  Good  Fund of Knowledge:Good  Language: Good  Akathisia:   Negative  Handed:  Right  AIMS (if indicated):     Assets:  Resilience  ADL's:  Intact  Cognition: WNL  Sleep:  "really well"     Current Medications: Current Facility-Administered Medications  Medication Dose Route Frequency Provider Last Rate Last Dose  . acetaminophen (TYLENOL) tablet 650 mg  650 mg Oral Q6H PRN Harriet Butte, NP   650 mg at 10/07/14 0759  . alum & mag hydroxide-simeth (MAALOX/MYLANTA) 200-200-20 MG/5ML suspension 30 mL  30 mL Oral Q4H PRN Harriet Butte, NP      . citalopram (CELEXA) tablet 20 mg  20 mg Oral Daily Jenne Campus, MD   20 mg at 10/08/14 0844  . [START ON 10/09/2014] gabapentin (NEURONTIN) capsule 200 mg  200 mg Oral BID Myer Peer Jaiyon Wander, MD      . hydrochlorothiazide (MICROZIDE) capsule 12.5 mg  12.5 mg Oral Daily Oswald Hillock, MD      . ibuprofen (ADVIL,MOTRIN) tablet 600 mg  600 mg Oral Q6H PRN Jenne Campus, MD   600 mg at 10/07/14 1815  . loperamide (IMODIUM) capsule 2 mg  2 mg Oral PRN Jenne Campus, MD   2 mg at 10/04/14 1436  . loratadine (CLARITIN) tablet 10 mg  10 mg Oral Daily Jenne Campus, MD   10 mg at 10/08/14 0844  . LORazepam (ATIVAN) tablet 0.5 mg  0.5 mg Oral Q6H PRN Myer Peer Alazar Cherian, MD      . magnesium hydroxide (MILK OF MAGNESIA) suspension 30 mL  30 mL Oral Daily PRN Harriet Butte, NP      . metoprolol (LOPRESSOR) tablet 50 mg  50 mg Oral BID Jenne Campus, MD      . mirtazapine (REMERON) tablet 15 mg  15 mg Oral QHS Jenne Campus, MD   15 mg at 10/07/14 2235  . traZODone (DESYREL) tablet 50 mg  50 mg Oral QHS PRN Harriet Butte, NP   50 mg at 10/05/14 2204    Lab Results:  No results found for this or any previous visit (from the past 48 hour(s)).  Physical Findings: AIMS: Facial and Oral Movements Muscles of Facial Expression: None, normal Lips and Perioral Area: None, normal Jaw: None, normal Tongue: None, normal,Extremity Movements Upper (arms, wrists, hands, fingers): None, normal Lower (legs,  knees, ankles, toes): None, normal, Trunk Movements Neck, shoulders, hips: None, normal, Overall Severity Severity of abnormal movements (highest score from questions above): None, normal Incapacitation due to abnormal movements: None, normal Patient's awareness of abnormal movements (rate only patient's report): No Awareness, Dental Status Current problems with teeth and/or dentures?: No Does patient usually wear dentures?: No  CIWA:    COWS:      Assessment/ Plan-  Patient reports increased anxiety today, triggered in part by sensing tension and hostility in a group session between other peers, and  in part due to increased anxious ruminations as he approaches discharge. (Yesterday evening was reporting improvement and feeling he was feeling more ready for discharge ) . BP remains elevated, and anxiety may be a contributor. Denies medication side effects.   Treatment Plan Summary: Continue inpatient treatment. Increase Neurontin  To 200 mgrs BID  for anxiety/pain - side effects discussed. Continue  Remeron 15 mgrs QHS for sleep, anxiety, depression- side effects discussed  Continue Celexa 20 mgrs QDAY for anxiety, depression. Increase  Metoprolol to 50  mg BID for Blood Pressure management, and started on HCTZ as per Hospitalist recommendation. Start Ativan 0.5 mgrs Q 6 hours PRN to address anxiety.    Neita Garnet , MD 3:05 PM 10/08/2014 Medical Decision Making:  Established Problem, Stable/Improving (1), Review of Psycho-Social Stressors (1), Review or order clinical lab tests (1), Review of Medication Regimen & Side Effects (2) and Review of New Medication or Change in Dosage (2)

## 2014-10-08 NOTE — Progress Notes (Signed)
Phone consultation Called by psych Dr. Jama Flavorsobos regarding the patient's uncontrolled hypertension. He was started on metoprolol 25 g twice a day on 10/05/2014, blood pressure still close to 140/100s. No other symptoms. Renal functions shows normal BUN/creatinine. Advised  to increase metoprolol to 50 mg twice a day, and also will add hydrochlorothiazide 12.5 g by mouth daily.  If blood pressure not controlled in next 24 hours, can reconsult hospitalist service. Will sign off for now.

## 2014-10-09 MED ORDER — GABAPENTIN 300 MG PO CAPS
300.0000 mg | ORAL_CAPSULE | Freq: Two times a day (BID) | ORAL | Status: DC
  Administered 2014-10-09 – 2014-10-10 (×2): 300 mg via ORAL
  Filled 2014-10-09 (×2): qty 1
  Filled 2014-10-09: qty 28
  Filled 2014-10-09: qty 1
  Filled 2014-10-09: qty 28
  Filled 2014-10-09: qty 1

## 2014-10-09 MED ORDER — LORAZEPAM 0.5 MG PO TABS
0.5000 mg | ORAL_TABLET | Freq: Three times a day (TID) | ORAL | Status: DC
  Administered 2014-10-09 – 2014-10-10 (×3): 0.5 mg via ORAL
  Filled 2014-10-09 (×3): qty 1

## 2014-10-09 NOTE — [facility] Group Notes (Signed)
[facility] LCSW Group Therapy  Emotional Regulation 1:15 - 2: 30 PM        10/09/2014  2:47 PM    Type of Therapy:  Group Therapy  Participation Level:  Appropriate  Participation Quality:  Appropriate  Affect:  Appropriate  Cognitive:  Attentive Appropriate  Insight:  Developing/Improving Engaged  Engagement in Therapy:  Developing/Improving Engaged  Modes of Intervention:  Discussion Exploration Problem-Solving Supportive  Summary of Progress/Problems:  Group topic was emotional regulations. Patient participated in the discussion.  He shared the emotions he has to overcome is frustration.  He talked about how it feels it does not appear people are listening to him or taking his concerns seriously.  Patient was able to identify approprite coping skills.  Brent Rios, Brent Rios 10/09/2014 2:47 PM

## 2014-10-09 NOTE — Progress Notes (Signed)
Patient ID: Brent Rios, male   DOB: 04/17/1986, 29 y.o.   MRN: [id number] [facility] MD Progress Note  10/09/2014 2:31 PM Wilber Fini  MRN:  [id number] Subjective:  Patient reports that he continues to feel quite anxious, although he did feel better yesterday evening and he feels Ativan may be helpful. He is not endorsing medication side effects.  Objective:    I have discussed case with treatment team and have met with patient. Patient has a long history of Anxiety in addition to his mood disorder, and worries significantly . He has been ambivalent about whether to return home after discharge, due to family / home related stress. He had recently stated he preferred to go back home, but now that he is closer to discharge has thought that " this may not be the best thing for me". He had friends who visited him yesterday evening and states that they suggested he should not return home, exacerbating his ambivalence about this. Other anxiety symptoms include having a sense of impending panic at times, a subjective feeling of free floating anxiety, and a tendency to worry. States that when very anxious he develops obsessions about bathrooms, and has difficulty using public restrooms . Insofar as his depression, he states he feels better, and is denying any severe sadness or anhedonia.  Tolerating medications well and denies side effects. No disruptive behaviors on unit. Has been participating in milieu. Responds fairly to support, encouragement, empathy. Blood pressure reading improved and denies side effects from current antihypertensive medications.     Principal Problem: Major depressive disorder, recurrent episode, severe Diagnosis:   Patient Active Problem List   Diagnosis Date Noted  . Major depressive disorder, recurrent episode, severe [F33.2] 10/04/2014   Total Time spent with patient: 25 minutes    Past Medical History:  Past Medical History  Diagnosis Date  . Hypertension    History  reviewed. No pertinent past surgical history. Family History: History reviewed. No pertinent family history. Social History:  History  Alcohol Use  . Yes    Comment: socially     History  Drug Use No    History   Social History  . Marital Status: Single    Spouse Name: N/A  . Number of Children: N/A  . Years of Education: N/A   Social History Main Topics  . Smoking status: Never Smoker   . Smokeless tobacco: Not on file  . Alcohol Use: Yes     Comment: socially  . Drug Use: No  . Sexual Activity: No   Other Topics Concern  . None   Social History Narrative   Additional History:    Sleep:  Variable   Appetite:  Good    Assessment:   Musculoskeletal: Strength & Muscle Tone: within normal limits Gait & Station: normal Patient leans: N/A   Psychiatric Specialty Exam: Physical Exam  Vitals reviewed.   ROS- no nausea, no vomiting, no headache reported today  Blood pressure 129/79, pulse 72, temperature 98.1 F (36.7 C), temperature source Oral, resp. rate 16, height _0  (1.727 m), weight 288 lb (130.636 kg).Body mass index is 43.8 kg/(m^2).  General Appearance: improved grooming  Eye Contact::  Good  Speech:  Normal   Volume:  Normal  Mood:   Mood improved- minimizes ongoing depression at present  Affect:   Reactive, but remains Anxious   Thought Process:  Linear   Orientation:  Full (Time, Place, and Person)  Thought Content:  Ruminative about family stressors, no hallucinations,  no delusions   Suicidal Thoughts:  No- at this time denies any thoughts of hurting self or anyone else   Homicidal Thoughts:  No  Memory:  Immediate;   Good Recent;   Good Remote;   Good  Judgement:  NA  Insight:  Good  Psychomotor Activity: normal  Concentration:  Good  Recall:  Good  Fund of Knowledge:Good  Language: Good  Akathisia:  Negative  Handed:  Right  AIMS (if indicated):     Assets:  Resilience  ADL's:  Intact  Cognition: WNL  Sleep:  "really well"      Current Medications: Current Facility-Administered Medications  Medication Dose Route Frequency Provider Last Rate Last Dose  . acetaminophen (TYLENOL) tablet 650 mg  650 mg Oral Q6H PRN Harriet Butte, NP   650 mg at 10/07/14 0759  . alum & mag hydroxide-simeth (MAALOX/MYLANTA) 200-200-20 MG/5ML suspension 30 mL  30 mL Oral Q4H PRN Harriet Butte, NP      . citalopram (CELEXA) tablet 20 mg  20 mg Oral Daily Jenne Campus, MD   20 mg at 10/09/14 1610  . gabapentin (NEURONTIN) capsule 200 mg  200 mg Oral BID Jenne Campus, MD   200 mg at 10/09/14 9604  . hydrochlorothiazide (MICROZIDE) capsule 12.5 mg  12.5 mg Oral Daily Oswald Hillock, MD   12.5 mg at 10/09/14 5409  . ibuprofen (ADVIL,MOTRIN) tablet 600 mg  600 mg Oral Q6H PRN Jenne Campus, MD   600 mg at 10/07/14 1815  . loperamide (IMODIUM) capsule 2 mg  2 mg Oral PRN Jenne Campus, MD   2 mg at 10/04/14 1436  . loratadine (CLARITIN) tablet 10 mg  10 mg Oral Daily Jenne Campus, MD   10 mg at 10/09/14 8119  . LORazepam (ATIVAN) tablet 0.5 mg  0.5 mg Oral Q6H PRN Jenne Campus, MD   0.5 mg at 10/09/14 1217  . magnesium hydroxide (MILK OF MAGNESIA) suspension 30 mL  30 mL Oral Daily PRN Harriet Butte, NP      . metoprolol (LOPRESSOR) tablet 50 mg  50 mg Oral BID Jenne Campus, MD   50 mg at 10/09/14 1478  . mirtazapine (REMERON) tablet 15 mg  15 mg Oral QHS Jenne Campus, MD   15 mg at 10/08/14 2309  . traZODone (DESYREL) tablet 50 mg  50 mg Oral QHS PRN Harriet Butte, NP   50 mg at 10/08/14 2309    Lab Results:  No results found for this or any previous visit (from the past 48 hour(s)).  Physical Findings: AIMS: Facial and Oral Movements Muscles of Facial Expression: None, normal Lips and Perioral Area: None, normal Jaw: None, normal Tongue: None, normal,Extremity Movements Upper (arms, wrists, hands, fingers): None, normal Lower (legs, knees, ankles, toes): None, normal, Trunk Movements Neck,  shoulders, hips: None, normal, Overall Severity Severity of abnormal movements (highest score from questions above): None, normal Incapacitation due to abnormal movements: None, normal Patient's awareness of abnormal movements (rate only patient's report): No Awareness, Dental Status Current problems with teeth and/or dentures?: No Does patient usually wear dentures?: No  CIWA:    COWS:      Assessment/ Plan-  Depression is improving, but anxiety has become more prominent symptom, particularly as he approaches discharge. He is ambivalent about whether to return home- feels he should but at the same time worries that home /family related tension will cause increased anxiety. At this time states  he is leaning towards going to live with a friend for a period of time. Denies medication side effects.   Treatment Plan Summary: Continue inpatient treatment. Increase Neurontin  To 200 mgrs TID  for anxiety/pain - side effects discussed. Continue  Remeron 15 mgrs QHS for sleep, anxiety, depression- side effects discussed  Continue Celexa 20 mgrs QDAY for anxiety, depression. Continue Metoprolol 50  mg BID HCTZ  For management of HTN, as per Hospitalist recommendation. Change Ativan to 0.5 mgrs TID for management of anxiety- we discussed medication side effects and potential for sedation and abuse.    Neita Garnet , MD 2:31 PM 10/09/2014 Medical Decision Making:  Established Problem, Stable/Improving (1), Review of Psycho-Social Stressors (1), Review or order clinical lab tests (1), Review of Medication Regimen & Side Effects (2) and Review of New Medication or Change in Dosage (2)

## 2014-10-09 NOTE — Progress Notes (Signed)
Adult Psychoeducational Group Note  Date:  10/09/2014 Time:  9:06 PM  Group Topic/Focus:  Wrap-Up Group:   The focus of this group is to help patients review their daily goal of treatment and discuss progress on daily workbooks.  Participation Level:  Active  Participation Quality:  Appropriate and Attentive  Affect:  Appropriate  Cognitive:  Appropriate  Insight: Appropriate  Engagement in Group:  Engaged  Modes of Intervention:  Discussion  Additional Comments:  Pt stated he enjoys playing pool and that he had an okay day. Pt stated he wants to work on stability and keeping his anxiety under control.  Caswell CorwinOwen, Mariha Sleeper C 10/09/2014, 9:06 PM

## 2014-10-09 NOTE — Progress Notes (Signed)
D: Pt denies SI/HI/AVH. Pt is pleasant and cooperative. Pt appears to have a matter of fact attitude, but is very polite. Pt stated a combination of most of his stressors came on him back to back and he could not cope. Pt encouraged to work on his coping skills since he has no control over the situations.   A: Pt was offered support and encouragement. Pt was given scheduled medications. Pt was encourage to attend groups. Q 15 minute checks were done for safety.    R:Pt attends groups and interacts well with peers and staff. Pt is taking medication. Pt has no complaints at this time .Pt receptive to treatment and safety maintained on unit.

## 2014-10-09 NOTE — Progress Notes (Signed)
D: Pt denies SI/HI/AVH. Pt is pleasant and cooperative. Pt plans to find a place to go when he leaves [facility], due to home not being a safe place.   A: Pt was offered support and encouragement. Pt was given scheduled medications. Pt was encourage to attend groups. Q 15 minute checks were done for safety.  R:Pt attends groups and interacts well with peers and staff. Pt is taking medication. Pt has no complaints.Pt receptive to treatment and safety maintained on unit.

## 2014-10-09 NOTE — Progress Notes (Signed)
D:  Per pt self inventory pt reports sleeping good, appetite fair, energy level normal, ability to pay attention poor, rates depression at a 0 out of 10, hopelessness at a 1 out of 10, anxiety at a 4 out of 10, denies SI/AVH, endorses HI towards a patient on the 300 hall, states this am "i will kill him if he tries something", Charge RN notified, goal today: anxiety control and deep breathing.     A:  Emotional support provided, Encouraged pt to continue with treatment plan and attend all group activities, q15 min checks maintained for safety.  R:  Pt is going to groups, receptive, pleasant with staff, med compliant.

## 2014-10-09 NOTE — Plan of Care (Signed)
Problem: Alteration in mood; excessive anxiety as evidenced by: Goal: STG-Pt can identify coping skills to manage panic/anxiety (Patient can identify at least ____ coping skills to manage panic/anxiety attack)  Outcome: Progressing Pt stated he will try to get away from situation, pray , talk to people when he gets overwhelmed next time.   Problem: Alteration in mood Goal: LTG-Patient reports reduction in suicidal thoughts (Patient reports reduction in suicidal thoughts and is able to verbalize a safety plan for whenever patient is feeling suicidal)  Outcome: Progressing Pt denies SI at this time

## 2014-10-09 NOTE — [facility] Group Notes (Signed)
   [facility]  10/09/2014  8:45 AM   Participation Quality: Alert, Appropriate and Oriented  Mood/Affect: Anxious  Depression Rating: 0  Anxiety Rating: 6  Thoughts of Suicide: Pt denies SI/HI  Will you contract for safety? Yes  Current AVH: Pt denies  Plan for Discharge/Comments: Pt attended discharge planning group and actively participated in group. CSW provided pt with today's workbook. Patient reports feeling anxious about returning home and is unsure if he would prefer to go to another facility at discharge. If patient returns home, [facility]  Transportation Means: Pt reports access to transportation  Supports: No supports mentioned at this time  Samuella BruinKristin Otelia Hettinger, MSW, [employer]CSWA Clinical Social Worker [facility] [phone]

## 2014-10-09 NOTE — Progress Notes (Signed)
Recreation Therapy Notes  Date: 10/09/14 Time: 9:30am Location: [address] Group Room  Group Topic: Stress Management  Goal Area(s) Addresses:  Patient will verbalize importance of using healthy stress management.  Patient will identify positive emotions associated with healthy stress management.   Intervention: Progressive Muscle Relaxation Script  Activity :  Patient will listen as LRT leads them through progressive muscle relaxation.  Patients will be lead through each muscle group from head to toe.  Education:  Stress Management, Discharge Planning.   Education Outcome: Acknowledges edcuation/In group clarification offered/Needs additional education  Clinical Observations/Feedback: Patient did not attend.  Brent Rios, LRT/CTRS         Brent Rios 10/09/2014 1:44 PM 

## 2014-10-10 DIAGNOSIS — F332 Major depressive disorder, recurrent severe without psychotic features: Secondary | ICD-10-CM | POA: Insufficient documentation

## 2014-10-10 DIAGNOSIS — F411 Generalized anxiety disorder: Secondary | ICD-10-CM | POA: Insufficient documentation

## 2014-10-10 MED ORDER — HYDROCHLOROTHIAZIDE 12.5 MG PO CAPS: 12.5000 mg | ORAL_CAPSULE | Freq: Every day | ORAL | Status: DC

## 2014-10-10 MED ORDER — LORAZEPAM 0.5 MG PO TABS: 0.5000 mg | ORAL_TABLET | Freq: Three times a day (TID) | ORAL | Status: DC

## 2014-10-10 MED ORDER — METOPROLOL TARTRATE 50 MG PO TABS: 50.0000 mg | ORAL_TABLET | Freq: Two times a day (BID) | ORAL | Status: DC

## 2014-10-10 MED ORDER — MIRTAZAPINE 15 MG PO TABS: 15.0000 mg | ORAL_TABLET | Freq: Every day | ORAL | Status: DC

## 2014-10-10 MED ORDER — TRAZODONE HCL 50 MG PO TABS: 50.0000 mg | ORAL_TABLET | Freq: Every evening | ORAL | Status: DC | PRN

## 2014-10-10 MED ORDER — CITALOPRAM HYDROBROMIDE 20 MG PO TABS: 20.0000 mg | ORAL_TABLET | Freq: Every day | ORAL | Status: DC

## 2014-10-10 MED ORDER — LORATADINE 10 MG PO TABS: 10.0000 mg | ORAL_TABLET | Freq: Every day | ORAL | Status: DC

## 2014-10-10 MED ORDER — GABAPENTIN 300 MG PO CAPS: 300.0000 mg | ORAL_CAPSULE | Freq: Two times a day (BID) | ORAL | Status: DC

## 2014-10-10 NOTE — [facility] Suicide Risk Assessment (Addendum)
Brent Rios   Demographic Factors:  29 year old man, single, lives with parents and sister , works as a  Chiropractor  and as Manufacturing systems engineer  Total Time spent with patient: 30 minutes  Musculoskeletal: Strength & Muscle Tone: within normal limits Gait & Station: normal Patient leans: N/A  Psychiatric Specialty Exam: Physical Exam  ROS  Blood pressure 126/82, pulse 73, temperature 97.7 F (36.5 C), temperature source Oral, resp. rate 18, height  (1.727 m), weight 288 lb (130.636 kg).Body mass index is 43.8 kg/(m^2).  General Appearance: Well Groomed  Eye Contact::  Good  Speech:  Normal Rate409  Volume:  Normal  Mood:  Euthymic- states that today he is feeling better, and that mood seems " back to normal today"  Affect:  Appropriate and reactive, less anxious   Thought Process:  Goal Directed and Linear  Orientation:  Full (Time, Place, and Person)  Thought Content:  denies hallucinations, no delusions, not internally preoccupied , less ruminative about stressors  Suicidal Thoughts:  No- denies any plan or intention of hurting self   Homicidal Thoughts:  No  Memory:  Recent and remote grossly intact   Judgement:  Other:  improved   Insight:  improved   Psychomotor Activity:  Normal  Concentration:  Good  Recall:  Good  Fund of Knowledge:Good  Language: Good  Akathisia:  Negative  Handed:  Right  AIMS (if indicated):     Assets:  Communication Skills Desire for Improvement Resilience Social Support Talents/Skills  Sleep:  Number of Hours: 6.5  Cognition: WNL  ADL's:  Improved    Have you used any form of tobacco in the last 30 days? (Cigarettes, Smokeless Tobacco, Cigars, and/or Pipes): No  Has this patient used any form of tobacco in the last 30 days? (Cigarettes, Smokeless Tobacco, Cigars, and/or Pipes) No  Mental Status Per Nursing Rios::   On Admission:  Self-harm thoughts, Self-harm behaviors, Suicidal ideation indicated by  patient  Current Mental Status by Physician: At this time patient is alert , attentive, well related, well groomed, feeling " ready for discharge". Mood is improved and is presenting euthymic, affect is appropriate, and fuller in range, states he is feeling less anxious, no thought disorder, no SI or HI, no psychotic symptoms, future oriented .  Denies medication side effects at this time.   Loss Factors: Family related stressors   Historical Factors: One prior psychiatric admission for depression, suicidal ideations  Risk Reduction Factors:   Sense of responsibility to family, Religious beliefs about death, Employed, Living with another person, especially a relative, Positive social support and Positive coping skills or problem solving skills  Continued Clinical Symptoms:  Patient reports he is doing better and that today he is feeling better than he has " in a while". He presents euthymic, affect is fuller in range, less anxious , and is feeling ready for discharge. Of note, patient states that he has decided to go home . States "I think I will be able to handle the stress, and I love my family". States " if the stress becomes worse there, I will move out to live with one of my friends for a while ".  Cognitive Features That Contribute To Risk:  No gross cognitive deficits noted upon discharge. Is alert , attentive, and oriented x 3   Suicide Risk:  Mild:  Suicidal ideation of limited frequency, intensity, duration, and specificity.  There are no identifiable plans, no associated intent, mild dysphoria  and related symptoms, good self-control (both objective and subjective Rios), few other risk factors, and identifiable protective factors, including available and accessible social support.  Principal Problem: Major depressive disorder, recurrent episode, severe Discharge Diagnoses:  Patient Active Problem List   Diagnosis Date Noted  . Major depressive disorder, recurrent episode,  severe [F33.2] 10/04/2014    Follow-up Information    Follow up with [facility] On 10/11/2014.   Why:  Please go to [facility] walk in clinic on Friday, Oct 11, 2014 or any weekday between 8AM - 3PM   Contact information:   [address] [place], [place]   [address]  [phone]   [phone]      Follow up with [facility] On 10/10/2014.   Why:  You are scheduled to see [facility] on Thursday, Oct 10, 2014 at [facility]   Contact information:   [address] [place], [place]   [address]  [phone]      Follow up with [facility]    . Call today.   Why:  For further follow up of Hypertension and refills for blood pressure medications   Contact information:   [address] [facility] [place] [phone]      Plan Of Care/Follow-up recommendations:  Activity:  as tolerated Diet:  regular Tests:  NA Other:  see below  Is patient on multiple antipsychotic therapies at discharge:  No   Has Patient had three or more failed trials of antipsychotic monotherapy by history:  No  Recommended Plan for Multiple Antipsychotic Therapies: NA   Patient is leaving in good spirits, and states he is feeling ready for discharge and to return home. Plans to follow up as above. Plans to follow up with PCP for monitoring and management of elevated BP.      Brent Rios 10/10/2014, 12:16 PM

## 2014-10-10 NOTE — Discharge Summary (Signed)
Physician Discharge Summary Note  Patient:  Brent Rios is an 29 y.o., male MRN:  [id number] DOB:  [birth year] Patient phone:  [phone] (home)  Patient address:   [address] [place] [address],  Total Time spent with patient: 30 minutes  Date of Admission:  10/03/2014 Date of Discharge: 10/10/14  Reason for Admission:  Worsening depression with suicidal ideation  Principal Problem: Major depressive disorder, recurrent episode, severe Discharge Diagnoses: Patient Active Problem List   Diagnosis Date Noted  . Major depressive disorder, recurrent episode, severe [F33.2] 10/04/2014   Musculoskeletal: Strength & Muscle Tone: within normal limits Gait & Station: normal Patient leans: N/A  Psychiatric Specialty Exam: Physical Exam  Psychiatric: He has a normal mood and affect. His speech is normal and behavior is normal. Thought content normal.    Review of Systems  Constitutional: Negative.   HENT: Negative.   Eyes: Negative.   Respiratory: Negative.   Cardiovascular: Negative.   Gastrointestinal: Negative.   Genitourinary: Negative.   Musculoskeletal: Negative.   Skin: Negative.   Neurological: Negative.   Endo/Heme/Allergies: Negative.   Psychiatric/Behavioral: Positive for depression (Stabilized with treatments). Negative for suicidal ideas, hallucinations, memory loss and substance abuse. The patient is not nervous/anxious and does not have insomnia.     Blood pressure 126/82, pulse 73, temperature 97.7 F (36.5 C), temperature source Oral, resp. rate 18, height  (1.727 m), weight 130.636 kg (288 lb).Body mass index is 43.8 kg/(m^2).     Have you used any form of tobacco in the last 30 days? (Cigarettes, Smokeless Tobacco, Cigars, and/or Pipes): No  Has this patient used any form of tobacco in the last 30 days? (Cigarettes, Smokeless Tobacco, Cigars, and/or Pipes) No  Past Medical History:  Past Medical History  Diagnosis Date  . Hypertension    History reviewed. No pertinent past surgical history. Family History: History reviewed. No pertinent family history. Social History:  History  Alcohol Use  . Yes    Comment: socially     History  Drug Use No    History   Social History  . Marital Status: Single    Spouse Name: N/A  . Number of Children: N/A  . Years of Education: N/A   Social History Main Topics  . Smoking status: Never Smoker   . Smokeless tobacco: Not on file  . Alcohol Use: Yes     Comment: socially  . Drug Use: No  . Sexual Activity: No   Other Topics Concern  . None   Social History Narrative   Risk to Self: Is patient at risk for suicide?: Yes What has been your use of drugs/alcohol within the last 12 months?: Patient reports drinking alcohol weekly Risk to Others:   Prior Inpatient Therapy:   Prior Outpatient Therapy:    Level of Care:  OP  Hospital Course:   [name] is a 29 year old man, who lives with parents and sister. He states he has been feeling more depressed and also experiencing a lot of anxiety. He attributes this to family stressors. These stressors include his parents' health gradually deteriorating, his sister having a serious medical condition ( autoimmune disease ) for which he has taken the role of being her " caregiver", taking her to and from appointments, helping her with daily activities, and more recently his brother separating. Patient states that these stressors have become severely overwhelming for him, and he has developed depression, anxiety, and increased suicidal ideations. States he has a long  history of thinking of suicide whenever he feels significantly stressed, and yesterday was thinking of hanging self , but states he refrained " because I did not want it to appear as if it was a case of erotic auto-asphyxiation and humiliate my family".  Due to severe subjective sense of anxiety, he recently left home impulsively and " just walked", eventually requesting to be  brought to hospital. Another contributing factor to depression is that he had been off Prozac for several weeks, months, and restarted it only 3 weeks ago or so, but states " it does not seem to be working this time around ".          Lutricia HorsfallSeth Wisor was admitted to the adult 400 unit. He was evaluated and his symptoms were identified. Medication management was discussed and initiated. The patient was started on several medications to stabilize his condition. Patient was started on Celexa 20 mg daily for depression and Neurontin 300 mg twice daily for mood stabilization. He was also given Remeron 15 mg at bedtime to help with insomnia related to depressive symptoms. Due to the report during follow up assessments about feelings of impending panic at times the patient was later started on Ativan 0.5 mg three times daily. He was oriented to the unit and encouraged to participate in unit programming. Medical problems were identified and treated appropriately. Patient was also ordered medications to help control his elevated blood pressure. This included HCTZ 12.5 mg daily along with Lopressor 50 mg twice daily. His blood pressures were much improved by time of discharge. Home medication was restarted as needed.        The patient was evaluated each day by a clinical provider to ascertain the patient's response to treatment.  Improvement was noted by the patient's report of decreasing symptoms, improved sleep and appetite, affect, medication tolerance, behavior, and participation in unit programming.  He was asked each day to complete a self inventory noting mood, mental status, pain, new symptoms, anxiety and concerns.         He responded well to medication and being in a therapeutic and supportive environment. Positive and appropriate behavior was noted and the patient was motivated for recovery.  The patient worked closely with the treatment team and case manager to develop a discharge plan with appropriate goals.  Coping skills, problem solving as well as relaxation therapies were also part of the unit programming. The patient continued to reports symptoms of anxiety especially related to previous family stressors. Pennie RushingSeth was noted to have a long history of anxiety in addition to his mood disorder.  He reported feeling a therapeutic response to the Ativan that was added.          By the day of discharge he was in much improved condition than upon admission.  Symptoms were reported as significantly decreased or resolved completely.  The patient denied SI/HI and voiced no AVH. He was motivated to continue taking medication with a goal of continued improvement in mental health.  Lutricia HorsfallSeth Pelle was discharged home with a plan to follow up as noted below. The patient was provided with sample medications and prescriptions at time of discharge. He left [facility] in stable condition with all belongings returned to him.   Consults:  None  Significant Diagnostic Studies:  Chemistry panel, CBC, TSH, UDS negative,   Discharge Vitals:   Blood pressure 126/82, pulse 73, temperature 97.7 F (36.5 C), temperature source Oral, resp. rate 18, height 5\' 8"  (1.727 m), weight  130.636 kg (288 lb). Body mass index is 43.8 kg/(m^2). Lab Results:   No results found for this or any previous visit (from the past 72 hour(s)).  Physical Findings: AIMS: Facial and Oral Movements Muscles of Facial Expression: None, normal Lips and Perioral Area: None, normal Jaw: None, normal Tongue: None, normal,Extremity Movements Upper (arms, wrists, hands, fingers): None, normal Lower (legs, knees, ankles, toes): None, normal, Trunk Movements Neck, shoulders, hips: None, normal, Overall Severity Severity of abnormal movements (highest score from questions above): None, normal Incapacitation due to abnormal movements: None, normal Patient's awareness of abnormal movements (rate only patient's report): No Awareness, Dental Status Current problems with  teeth and/or dentures?: No Does patient usually wear dentures?: No  CIWA:    COWS:      See Psychiatric Specialty Exam and Suicide Risk Assessment completed by Attending Physician prior to discharge.  Discharge destination:  Home  Is patient on multiple antipsychotic therapies at discharge:  No   Has Patient had three or more failed trials of antipsychotic monotherapy by history:  No  Recommended Plan for Multiple Antipsychotic Therapies: NA     Medication List    STOP taking these medications        FLUoxetine 40 MG capsule  Commonly known as:  PROZAC      TAKE these medications      Indication   acetaminophen 500 MG tablet  Commonly known as:  TYLENOL  Take 1,000 mg by mouth every 6 (six) hours as needed for moderate pain or headache.      citalopram 20 MG tablet  Commonly known as:  CELEXA  Take 1 tablet (20 mg total) by mouth daily. For depression/anxiety   Indication:  Depression     diphenhydrAMINE 25 MG tablet  Commonly known as:  BENADRYL  Take 25 mg by mouth every 6 (six) hours as needed for allergies.      gabapentin 300 MG capsule  Commonly known as:  NEURONTIN  Take 1 capsule (300 mg total) by mouth 2 (two) times daily. For mood control   Indication:  Agitation, Anxiety     hydrochlorothiazide 12.5 MG capsule  Commonly known as:  MICROZIDE  Take 1 capsule (12.5 mg total) by mouth daily.   Indication:  High Blood Pressure     ibuprofen 200 MG tablet  Commonly known as:  ADVIL,MOTRIN  Take 200 mg by mouth every 6 (six) hours as needed for fever or moderate pain.      loratadine 10 MG tablet  Commonly known as:  CLARITIN  Take 1 tablet (10 mg total) by mouth daily. For allergies.   Indication:  Hayfever     LORazepam 0.5 MG tablet  Commonly known as:  ATIVAN  Take 1 tablet (0.5 mg total) by mouth 3 (three) times daily.   Indication:  Feeling Anxious, Anxiousness associated with Depression     metoprolol 50 MG tablet  Commonly known as:   LOPRESSOR  Take 1 tablet (50 mg total) by mouth 2 (two) times daily.   Indication:  High Blood Pressure     mirtazapine 15 MG tablet  Commonly known as:  REMERON  Take 1 tablet (15 mg total) by mouth at bedtime.   Indication:  Trouble Sleeping, Major Depressive Disorder     traZODone 50 MG tablet  Commonly known as:  DESYREL  Take 1 tablet (50 mg total) by mouth at bedtime as needed for sleep (May repeat X1).   Indication:  Trouble Sleeping  Follow-up Information    Follow up with [facility] On 10/11/2014.   Why:  Please go to [facility] walk in clinic on Friday, Oct 11, 2014 or any weekday between 8AM - 3PM   Contact information:   [address] [place], [place]   [address]  [phone]   [phone]      Follow up with [facility] On 10/10/2014.   Why:  You are scheduled to see Thyra Breed on Thursday, Oct 10, 2014 at [facility] information:   [address] [place], [place]   [address]  [phone]      Follow up with [facility]    . Call today.   Why:  For further follow up of Hypertension and refills for blood pressure medications   Contact information:   [address] [place] [address] [phone]      Follow-up recommendations:   Activity: as tolerated Diet: regular Tests: NA Other: see below  Comments:   Take all your medications as prescribed by your mental healthcare provider.  Report any adverse effects and or reactions from your medicines to your outpatient provider promptly.  Patient is instructed and cautioned to not engage in alcohol and or illegal drug use while on prescription medicines.  In the event of worsening symptoms, patient is instructed to call the crisis hotline, 911 and or go to the nearest ED for appropriate evaluation and treatment of symptoms.  Follow-up with your primary care provider for your other medical issues, concerns and or health  care needs.   Total Discharge Time: Greater than 30 minutes  Signed: Lydon Vansickle NP-C 10/10/2014, 10:26 AM   Patient seen, Suicide Assessment Completed.  Disposition Plan Reviewed

## 2014-10-10 NOTE — Progress Notes (Signed)
  [facility] :  Will you be returning to the same living situation after discharge:  Yes,  Patient is returning to his home. At discharge, do you have transportation home?: Yes,  Patient to arrange transportation home. Do you have the ability to pay for your medications: No.  Patient will need assistance with indigent medications.  Release of information consent forms completed and in the chart;  Patient's signature needed at discharge.  Patient to Follow up at: Follow-up Information    Follow up with [facility] On 10/11/2014.   Why:  Please go to [facility] walk in clinic on Friday, Oct 11, 2014 or any weekday between 8AM - 3PM   Contact information:   [address] [place], [place]   [address]  [phone]   [phone]      Follow up with [facility] On 10/10/2014.   Why:  You are scheduled to see [facility]ra BreedHarry Suggs on Thursday, Oct 10, 2014 at [facility]   Contact information:   [address] [place], [place]   [address]  [phone]      Patient denies SI/HI: Patient no longer endorsing SI/HI or other thoughts of self harm.  Safety Planning and Suicide Prevention discussed:  .Reviewed with all patients during discharge planning group   Have you used any form of tobacco in the last 30 days? (Cigarettes, Smokeless Tobacco, Cigars, and/or Pipes): No  Has patient been referred to the Quitline?:  No referral needed to quitline.   Wynn BankerHodnett, Marsena Taff Hairston 10/10/2014, 9:45 AM

## 2014-10-10 NOTE — Progress Notes (Signed)
Patient packed and ready to discharge. Teaching explained, Rx's given along with sample meds. All belongings returned. Patient verbalized understanding. Denies  SI/HI and is discharged in stable condition to family. Lawrence MarseillesFriedman, [place] Eakes

## 2014-10-10 NOTE — Progress Notes (Signed)
Patient ID: Brent HorsfallSeth Rios, male   DOB: 07/29/1985, 29 y.o.   MRN: [id number]  Adult Psychoeducational Group Note  Date:  10/10/2014 Time: 09:15am  Group Topic/Focus:  Orientation:   The focus of this group is to educate the patient on the purpose and policies of crisis stabilization and provide a format to answer questions about their admission.  The group details unit policies and expectations of patients while admitted. Self Care:   The focus of this group is to help patients understand the importance of self-care in order to improve or restore emotional, physical, spiritual, interpersonal, and financial health.  Participation Level:  Active  Participation Quality:  Attentive  Affect:  Flat  Cognitive:  Logical/Oriented  Insight: Good  Engagement in Group:  Engaged  Modes of Intervention:  Activity, Discussion, Education and Support  Additional Comments: Pt able to identify strengths in self care. Pt identified one healthy aspect of leisure and lifestyle changes to incorporate post discharge.   [name], [name] 10/10/2014, 10:03 AM

## 2014-10-10 NOTE — Progress Notes (Signed)
Recreation Therapy Notes  Animal-Assisted Activity (AAA) Program Checklist/Progress Notes Patient Eligibility Criteria Checklist & Daily Group note for Rec Tx Intervention  Date: 05.26.16 Time: 2:45pm [facility]: [address] [facility]   AAA/T Program Assumption of Risk Form signed by Patient/ or Parent Legal Guardian yes  Patient is free of allergies or sever asthma yes  Patient reports no fear of animals yes  Patient reports no history of cruelty to animalsyes  Patient understands his/her participation is voluntary yes  Patient washes hands before animal contact yes  Patient washes hands after animal contact yes  Education: Hand Washing, Appropriate Animal Interaction   Education Outcome: Acknowledges understanding/In group clarification offered/Needs additional education.   Clinical Observations/Feedback: Patient did not attend group.  Caroll RancherMarjette Persis Graffius, LRT/CTRS         Caroll RancherLindsay, Cadie Sorci A 10/10/2014 4:10 PM

## 2014-10-10 NOTE — Progress Notes (Signed)
Patient reports feeling good this morning. Reports restful sleep for the first time in awhile. "I feel like the meds have really changed. I'm feeling well." Denies pain other than a minimal headache that he has in the mornings. Does not request any prn for this. Patient medicated per order. Given support and encouragement. Discussed patient's progress with MD, tx team. He denies SI/HI and remains safe. Will await disposition. Lawrence MarseillesFriedman, Jaber Dunlow Eakes

## 2014-10-13 ENCOUNTER — Emergency Department (HOSPITAL_COMMUNITY)
Admission: EM | Admit: 2014-10-13 | Discharge: 2014-10-14 | Disposition: A | Discharge status: Home / Self Care | Payer: Federal, State, Local not specified - Other | Attending: Emergency Medicine | Admitting: Emergency Medicine

## 2014-10-13 DIAGNOSIS — Z915 Personal history of self-harm: Secondary | ICD-10-CM | POA: Insufficient documentation

## 2014-10-13 DIAGNOSIS — I10 Essential (primary) hypertension: Secondary | ICD-10-CM | POA: Insufficient documentation

## 2014-10-13 DIAGNOSIS — R45851 Suicidal ideations: Secondary | ICD-10-CM

## 2014-10-13 DIAGNOSIS — F4325 Adjustment disorder with mixed disturbance of emotions and conduct: Secondary | ICD-10-CM | POA: Insufficient documentation

## 2014-10-13 DIAGNOSIS — Z79899 Other long term (current) drug therapy: Secondary | ICD-10-CM | POA: Insufficient documentation

## 2014-10-13 DIAGNOSIS — F411 Generalized anxiety disorder: Secondary | ICD-10-CM | POA: Insufficient documentation

## 2014-10-13 LAB — CBC
HCT: 44.9 % (ref 39.0–52.0)
HEMOGLOBIN: 15 g/dL (ref 13.0–17.0)
MCH: 30.5 pg (ref 26.0–34.0)
MCHC: 33.4 g/dL (ref 30.0–36.0)
MCV: 91.3 fL (ref 78.0–100.0)
Platelets: 330 10*3/uL (ref 150–400)
RBC: 4.92 MIL/uL (ref 4.22–5.81)
RDW: 13 % (ref 11.5–15.5)
WBC: 9.9 10*3/uL (ref 4.0–10.5)

## 2014-10-13 LAB — RAPID URINE DRUG SCREEN, HOSP PERFORMED
AMPHETAMINES: NOT DETECTED
Barbiturates: NOT DETECTED
Benzodiazepines: NOT DETECTED
Cocaine: NOT DETECTED
Opiates: NOT DETECTED
TETRAHYDROCANNABINOL: NOT DETECTED

## 2014-10-13 MED ORDER — ONDANSETRON 8 MG PO TBDP
8.0000 mg | ORAL_TABLET | Freq: Once | ORAL | Status: AC
  Administered 2014-10-13: 8 mg via ORAL
  Filled 2014-10-13: qty 1

## 2014-10-13 NOTE — ED Notes (Signed)
Pt arrived to the ED with a complaint of suicidal ideation.  Pt has been having thoughts for about an hour.  Pt is concerned about his blood pressure.

## 2014-10-13 NOTE — ED Notes (Addendum)
Brent SimplerRobert Rios father wanted to leave number in case Psychiatry wished to get a hold of him  Phone number is [phone]

## 2014-10-14 DIAGNOSIS — R45851 Suicidal ideations: Secondary | ICD-10-CM | POA: Diagnosis not present

## 2014-10-14 DIAGNOSIS — F4325 Adjustment disorder with mixed disturbance of emotions and conduct: Secondary | ICD-10-CM | POA: Diagnosis not present

## 2014-10-14 LAB — SALICYLATE LEVEL: Salicylate Lvl: <4 mg/dL (ref 2.8–30.0)

## 2014-10-14 LAB — ETHANOL: Alcohol, Ethyl (B): <5 mg/dL (ref <5)

## 2014-10-14 LAB — COMPREHENSIVE METABOLIC PANEL
ALT: 77 U/L — ABNORMAL HIGH (ref 17–63)
AST: 44 U/L — ABNORMAL HIGH (ref 15–41)
Albumin: 4.1 g/dL (ref 3.5–5.0)
Alkaline Phosphatase: 67 U/L (ref 38–126)
Anion gap: 10 (ref 5–15)
BUN: 15 mg/dL (ref 6–20)
CHLORIDE: 105 mmol/L (ref 101–111)
CO2: 24 mmol/L (ref 22–32)
Calcium: 9.4 mg/dL (ref 8.9–10.3)
Creatinine, Ser: 0.93 mg/dL (ref 0.61–1.24)
GFR calc non Af Amer: >60 mL/min (ref >60)
Glucose, Bld: 113 mg/dL — ABNORMAL HIGH (ref 65–99)
POTASSIUM: 4.2 mmol/L (ref 3.5–5.1)
Sodium: 139 mmol/L (ref 135–145)
Total Bilirubin: 0.7 mg/dL (ref 0.3–1.2)
Total Protein: 7.3 g/dL (ref 6.5–8.1)

## 2014-10-14 LAB — ACETAMINOPHEN LEVEL: Acetaminophen (Tylenol), Serum: <10 ug/mL — ABNORMAL LOW (ref 10–30)

## 2014-10-14 MED ORDER — TRAZODONE HCL 50 MG PO TABS: 50.0000 mg | ORAL_TABLET | Freq: Every evening | ORAL | Status: DC | PRN

## 2014-10-14 MED ORDER — IBUPROFEN 200 MG PO TABS: 600.0000 mg | ORAL_TABLET | Freq: Three times a day (TID) | ORAL | Status: DC | PRN

## 2014-10-14 MED ORDER — ALUM & MAG HYDROXIDE-SIMETH 200-200-20 MG/5ML PO SUSP: 30.0000 mL | ORAL | Status: DC | PRN

## 2014-10-14 MED ORDER — CITALOPRAM HYDROBROMIDE 20 MG PO TABS
20.0000 mg | ORAL_TABLET | Freq: Every day | ORAL | Status: DC
  Administered 2014-10-14: 20 mg via ORAL
  Filled 2014-10-14: qty 1

## 2014-10-14 MED ORDER — MIRTAZAPINE 30 MG PO TABS
15.0000 mg | ORAL_TABLET | Freq: Every day | ORAL | Status: DC
  Administered 2014-10-14: 30 mg via ORAL
  Filled 2014-10-14: qty 1

## 2014-10-14 MED ORDER — NICOTINE 21 MG/24HR TD PT24: 21.0000 mg | MEDICATED_PATCH | Freq: Every day | TRANSDERMAL | Status: DC

## 2014-10-14 MED ORDER — LORATADINE 10 MG PO TABS
10.0000 mg | ORAL_TABLET | Freq: Every day | ORAL | Status: DC
  Administered 2014-10-14: 10 mg via ORAL
  Filled 2014-10-14: qty 1

## 2014-10-14 MED ORDER — GABAPENTIN 300 MG PO CAPS
300.0000 mg | ORAL_CAPSULE | Freq: Two times a day (BID) | ORAL | Status: DC
  Administered 2014-10-14 (×2): 300 mg via ORAL
  Filled 2014-10-14 (×2): qty 1

## 2014-10-14 MED ORDER — ZOLPIDEM TARTRATE 10 MG PO TABS: 10.0000 mg | ORAL_TABLET | Freq: Every evening | ORAL | Status: DC | PRN

## 2014-10-14 MED ORDER — LORAZEPAM 1 MG PO TABS: 1.0000 mg | ORAL_TABLET | Freq: Three times a day (TID) | ORAL | Status: DC | PRN

## 2014-10-14 MED ORDER — ONDANSETRON HCL 4 MG PO TABS: 4.0000 mg | ORAL_TABLET | Freq: Three times a day (TID) | ORAL | Status: DC | PRN

## 2014-10-14 MED ORDER — ACETAMINOPHEN 325 MG PO TABS: 650.0000 mg | ORAL_TABLET | ORAL | Status: DC | PRN

## 2014-10-14 MED ORDER — HYDROCHLOROTHIAZIDE 12.5 MG PO CAPS
12.5000 mg | ORAL_CAPSULE | Freq: Every day | ORAL | Status: DC
  Administered 2014-10-14: 12.5 mg via ORAL
  Filled 2014-10-14: qty 1

## 2014-10-14 MED ORDER — METOPROLOL TARTRATE 25 MG PO TABS
50.0000 mg | ORAL_TABLET | Freq: Two times a day (BID) | ORAL | Status: DC
  Administered 2014-10-14 (×2): 50 mg via ORAL
  Filled 2014-10-14 (×3): qty 2

## 2014-10-14 MED ORDER — LORAZEPAM 0.5 MG PO TABS
0.5000 mg | ORAL_TABLET | Freq: Three times a day (TID) | ORAL | Status: DC
  Administered 2014-10-14: 0.5 mg via ORAL
  Filled 2014-10-14: qty 1

## 2014-10-14 NOTE — ED Notes (Signed)
Pt. To [facility] from ED ambulatory without difficulty, to room 36  . Report from RN. Pt. Is alert and oriented, warm and dry in no distress. Pt. Denies SI, HI, and AVH. Pt. Calm and cooperative. Pt. Made aware of security cameras and Q15 minute rounds. Pt. Encouraged to let Nursing staff know of any concerns or needs.

## 2014-10-14 NOTE — ED Notes (Signed)
Pt. Noted in room. No complaints or concerns voiced. No distress or abnormal behavior noted. Will continue to monitor with security cameras. Q 15 minute rounds continue. 

## 2014-10-14 NOTE — Discharge Instructions (Signed)
For your ongoing behavioral health needs, you are advised [facility]:       [facility]      [address]      [place], [place] [address]      [phone]

## 2014-10-14 NOTE — BH Assessment (Addendum)
0025:  Consulted with Dr. Lynelle DoctorKnapp about Patient.  Reports Patient is experiencing SI with plans of smothering self, hanging, cutting neck, and crushing self.  Reports has  Mental Health Provider in community and [facility].  0045: Completed assessment on Patient.  Patient consented for Father to be called for collateral information.   [id number]:  Attempted to contact Patient's Father at [phone].  Message left to return call.   0100:  Consulted with Hulan FessIjeoma Nwaeze, Extender about Patient.  Per Extender; Patient meets inpatient criteria, seek outside placement .  0103:  Provided Dr. Lynelle DoctorKnapp with Patient disposition.  0105:  Provided Patient with disposition.    0140:  Patient's Father returned telephone call.  [name] reports the Patient has been experiencing several stressors recently and has difficulty managing stress.  He reports to his knowledge the Patient has been med compliant since [facility].

## 2014-10-14 NOTE — [facility] Suicide Risk Assessment (Signed)
Suicide Risk Assessment  Discharge Assessment   [facility]   Demographic Factors:  Male and Adolescent or young adult  Total Time spent with patient: 45 minutes  Musculoskeletal: Strength & Muscle Tone: within normal limits Gait & Station: normal Patient leans: N/A  Psychiatric Specialty Exam:     Blood pressure 103/69, pulse 64, temperature 97.9 F (36.6 C), temperature source Oral, resp. rate 18, SpO2 100 %.There is no weight on file to calculate BMI.  General Appearance: Casual  Eye Contact::  Good  Speech:  Normal Rate409  Volume:  Normal  Mood:  Depressed  Affect:  Congruent  Thought Process:  Coherent  Orientation:  Full (Time, Place, and Person)  Thought Content:  WDL  Suicidal Thoughts:  No  Homicidal Thoughts:  No  Memory:  Immediate;   Good Recent;   Good Remote;   Good  Judgement:  Good  Insight:  Fair  Psychomotor Activity:  Normal  Concentration:  Good  Recall:  Good  Fund of Knowledge:Good  Language: Good  Akathisia:  No  Handed:  Right  AIMS (if indicated):     Assets:  Housing Leisure Time Physical Health Resilience Social Support  Sleep:     Cognition: WNL  ADL's:  Intact      Has this patient used any form of tobacco in the last 30 days? (Cigarettes, Smokeless Tobacco, Cigars, and/or Pipes) No  Mental Status Per Nursing Assessment::   On Admission:    Suicidal ideations  Current Mental Status by Physician: NA  Loss Factors: NA  Historical Factors: NA  Risk Reduction Factors:   Sense of responsibility to family, Living with another person, especially a relative, Positive social support and Positive therapeutic relationship  Continued Clinical Symptoms:  Depression, mild  Cognitive Features That Contribute To Risk:  None    Suicide Risk:  Minimal: No identifiable suicidal ideation.  Patients presenting with no risk factors but with morbid ruminations; may be classified as minimal risk based on the  severity of the depressive symptoms  Principal Problem: Adjustment disorder with mixed disturbance of emotions and conduct Discharge Diagnoses:  Patient Active Problem List   Diagnosis Date Noted  . Adjustment disorder with mixed disturbance of emotions and conduct [F43.25] 10/14/2014    Priority: High  . Generalized anxiety disorder [F41.1]     Priority: High  . Major depressive disorder, recurrent, severe without psychotic features [F33.2]   . Major depressive disorder, recurrent episode, severe [F33.2] 10/04/2014      Plan Of Care/Follow-up recommendations:  Activity:  as tolerated Diet:  heart healthy diet  Is patient on multiple antipsychotic therapies at discharge:  No   Has Patient had three or more failed trials of antipsychotic monotherapy by history:  No  Recommended Plan for Multiple Antipsychotic Therapies: NA    Jamila Slatten, PMH-NP 10/14/2014, 9:43 AM

## 2014-10-14 NOTE — BH Assessment (Signed)
[facility] Assessment Progress Note  Per Thedore MinsMojeed Akintayo, MD this pt does not require psychiatric hospitalization at this time.  [facility]  His father is to be called to arrange for pt to be picked up.  Pt has signed Consent to Release Information to his father, Viviana SimplerRobert Dattilo [phone]), his sister, Idelle LeechLucretia Seide ([phone]), and to [facility].  A call was placed to the father at 10:00; call rolled to voice mail and a message was left.  A call was also placed to the sister, but no message was left.  Contact information has been included in pt's discharge instructions.  Pt's nurse has been notified.  Doylene Canninghomas Buster Schueller, MA Triage Specialist [phone]

## 2014-10-14 NOTE — Consult Note (Signed)
[place] [facility] Consult   Reason for Consult:  Suicidal ideations Referring Physician:  EDP Patient Identification: Brent Rios MRN:  [id number] Principal Diagnosis: Adjustment disorder with mixed disturbance of emotions and conduct Diagnosis:   Patient Active Problem List   Diagnosis Date Noted  . Adjustment disorder with mixed disturbance of emotions and conduct [F43.25] 10/14/2014    Priority: High  . Generalized anxiety disorder [F41.1]     Priority: High  . Major depressive disorder, recurrent, severe without psychotic features [F33.2]   . Major depressive disorder, recurrent episode, severe [F33.2] 10/04/2014    Total Time spent with patient: 45 minutes  Subjective:   Brent Rios is a 29 y.o. male patient does not warrant admission.  HPI:  The patient presented to the ED with suicidal ideations.  He denies suicidal ideations today, stating he feels better and more relaxed.  [name] was discharged from [facility] on 5/26 for similar issues.  He does not want to live with his mother/father/sister anymore and wants to move to [place].  [name] requests long term care for his depression and not wanting to return home to his stressors.  He can identify his triggers and coping skills of praying, deep breathing, and guided imagery.  Denies homicidal ideations, hallucinations, and alcohol/drug issues. HPI Elements:   Location:  generalized. Quality:  acute. Severity:  mild. Timing:  intermittent. Duration:  brief. Context:  stressors at home.  Past Medical History:  Past Medical History  Diagnosis Date  . Hypertension    No past surgical history on file. Family History: No family history on file. Social History:  History  Alcohol Use  . Yes    Comment: socially     History  Drug Use No    History   Social History  . Marital Status: Single    Spouse Name: N/A  . Number of Children: N/A  . Years of Education: N/A   Social History Main Topics  . Smoking status: Never Smoker    . Smokeless tobacco: Not on file  . Alcohol Use: Yes     Comment: socially  . Drug Use: No  . Sexual Activity: No   Other Topics Concern  . Not on file   Social History Narrative   Additional Social History:                          Allergies:   Allergies  Allergen Reactions  . Pollen Extract     Labs:  Results for orders placed or performed during the hospital encounter of 10/13/14 (from the past 48 hour(s))  Acetaminophen level     Status: Abnormal   Collection Time: 10/13/14 11:02 PM  Result Value Ref Range   Acetaminophen (Tylenol), Serum <10 (L) 10 - 30 ug/mL    Comment:        THERAPEUTIC CONCENTRATIONS VARY SIGNIFICANTLY. A RANGE OF 10-30 ug/mL MAY BE AN EFFECTIVE CONCENTRATION FOR MANY PATIENTS. HOWEVER, SOME ARE BEST TREATED AT CONCENTRATIONS OUTSIDE THIS RANGE. ACETAMINOPHEN CONCENTRATIONS >150 ug/mL AT 4 HOURS AFTER INGESTION AND >50 ug/mL AT 12 HOURS AFTER INGESTION ARE OFTEN ASSOCIATED WITH TOXIC REACTIONS.   CBC     Status: None   Collection Time: 10/13/14 11:02 PM  Result Value Ref Range   WBC 9.9 4.0 - 10.5 K/uL   RBC 4.92 4.22 - 5.81 MIL/uL   Hemoglobin 15.0 13.0 - 17.0 g/dL   HCT 44.9 39.0 - 52.0 %   MCV 91.3 78.0 -  100.0 fL   MCH 30.5 26.0 - 34.0 pg   MCHC 33.4 30.0 - 36.0 g/dL   RDW 13.0 11.5 - 15.5 %   Platelets 330 150 - 400 K/uL  Comprehensive metabolic panel     Status: Abnormal   Collection Time: 10/13/14 11:02 PM  Result Value Ref Range   Sodium 139 135 - 145 mmol/L   Potassium 4.2 3.5 - 5.1 mmol/L   Chloride 105 101 - 111 mmol/L   CO2 24 22 - 32 mmol/L   Glucose, Bld 113 (H) 65 - 99 mg/dL   BUN 15 6 - 20 mg/dL   Creatinine, Ser 0.93 0.61 - 1.24 mg/dL   Calcium 9.4 8.9 - 10.3 mg/dL   Total Protein 7.3 6.5 - 8.1 g/dL   Albumin 4.1 3.5 - 5.0 g/dL   AST 44 (H) 15 - 41 U/L   ALT 77 (H) 17 - 63 U/L   Alkaline Phosphatase 67 38 - 126 U/L   Total Bilirubin 0.7 0.3 - 1.2 mg/dL   GFR calc non Af Amer >60 >60 mL/min    GFR calc Af Amer >60 >60 mL/min    Comment: (NOTE) The eGFR has been calculated using the CKD EPI equation. This calculation has not been validated in all clinical situations. eGFR's persistently <60 mL/min signify possible Chronic Kidney Disease.    Anion gap 10 5 - 15  Ethanol (ETOH)     Status: None   Collection Time: 10/13/14 11:02 PM  Result Value Ref Range   Alcohol, Ethyl (B) <5 <5 mg/dL    Comment:        LOWEST DETECTABLE LIMIT FOR SERUM ALCOHOL IS 11 mg/dL FOR MEDICAL PURPOSES ONLY   Salicylate level     Status: None   Collection Time: 10/13/14 11:02 PM  Result Value Ref Range   Salicylate Lvl <3.3 2.8 - 30.0 mg/dL  Urine Drug Screen     Status: None   Collection Time: 10/13/14 11:07 PM  Result Value Ref Range   Opiates NONE DETECTED NONE DETECTED   Cocaine NONE DETECTED NONE DETECTED   Benzodiazepines NONE DETECTED NONE DETECTED   Amphetamines NONE DETECTED NONE DETECTED   Tetrahydrocannabinol NONE DETECTED NONE DETECTED   Barbiturates NONE DETECTED NONE DETECTED    Comment:        DRUG SCREEN FOR MEDICAL PURPOSES ONLY.  IF CONFIRMATION IS NEEDED FOR ANY PURPOSE, NOTIFY LAB WITHIN 5 DAYS.        LOWEST DETECTABLE LIMITS FOR URINE DRUG SCREEN Drug Class       Cutoff (ng/mL) Amphetamine      1000 Barbiturate      200 Benzodiazepine   295 Tricyclics       188 Opiates          300 Cocaine          300 THC              50     Vitals: Blood pressure 103/69, pulse 64, temperature 97.9 F (36.6 C), temperature source Oral, resp. rate 18, SpO2 100 %.  Risk to Self: Suicidal Ideation: No-Not Currently/Within Last 6 Months Suicidal Intent: No-Not Currently/Within Last 6 Months Is patient at risk for suicide?: Yes Suicidal Plan?: No-Not Currently/Within Last 6 Months Specify Current Suicidal Plan: Hang, cut, smother, crush self Access to Means: No What has been your use of drugs/alcohol within the last 12 months?: Patient denied How many times?: 1 Other Self  Harm Risks: None Triggers for Past  Attempts: Other (Comment) (Depression) Intentional Self Injurious Behavior: None Risk to Others: Homicidal Ideation: No Thoughts of Harm to Others: No Current Homicidal Intent: No Current Homicidal Plan: No Access to Homicidal Means: No Identified Victim: N/A History of harm to others?: No Assessment of Violence: None Noted Violent Behavior Description: N/A Does patient have access to weapons?: No (Patient denied any guns in the home) Criminal Charges Pending?: No Does patient have a court date: No Prior Inpatient Therapy: Prior Inpatient Therapy: Yes Prior Therapy Dates: 10-03-2014 to 10-10-2014 Prior Therapy Facilty/Provider(s): [facility] Reason for Treatment: SI and depression Prior Outpatient Therapy: Prior Outpatient Therapy: Yes Prior Therapy Dates: Currently Prior Therapy Facilty/Provider(s): [facility] Reason for Treatment: Depression and SI Does patient have an ACCT team?: No Does patient have Intensive In-House Services?  : No Does patient have [facility] services? : No Does patient have P4CC services?: No  Current Facility-Administered Medications  Medication Dose Route Frequency Provider Last Rate Last Dose  . acetaminophen (TYLENOL) tablet 650 mg  650 mg Oral Q4H PRN Rolland Porter, MD      . alum & mag hydroxide-simeth (MAALOX/MYLANTA) 200-200-20 MG/5ML suspension 30 mL  30 mL Oral PRN Rolland Porter, MD      . citalopram (CELEXA) tablet 20 mg  20 mg Oral Daily Rolland Porter, MD      . gabapentin (NEURONTIN) capsule 300 mg  300 mg Oral BID Rolland Porter, MD   300 mg at 10/14/14 0102  . hydrochlorothiazide (MICROZIDE) capsule 12.5 mg  12.5 mg Oral Daily Rolland Porter, MD      . ibuprofen (ADVIL,MOTRIN) tablet 600 mg  600 mg Oral Q8H PRN Rolland Porter, MD      . loratadine (CLARITIN) tablet 10 mg  10 mg Oral Daily Rolland Porter, MD      . LORazepam (ATIVAN) tablet 0.5 mg  0.5 mg Oral TID Rolland Porter, MD      . LORazepam (ATIVAN) tablet 1 mg  1 mg Oral  Q8H PRN Rolland Porter, MD      . metoprolol tartrate (LOPRESSOR) tablet 50 mg  50 mg Oral BID Rolland Porter, MD   50 mg at 10/14/14 0054  . mirtazapine (REMERON) tablet 15 mg  15 mg Oral QHS Rolland Porter, MD   30 mg at 10/14/14 0054  . nicotine (NICODERM CQ - dosed in mg/24 hours) patch 21 mg  21 mg Transdermal Daily Rolland Porter, MD      . ondansetron (ZOFRAN) tablet 4 mg  4 mg Oral Q8H PRN Rolland Porter, MD      . traZODone (DESYREL) tablet 50 mg  50 mg Oral QHS PRN Rolland Porter, MD      . zolpidem (AMBIEN) tablet 10 mg  10 mg Oral QHS PRN Rolland Porter, MD       Current Outpatient Prescriptions  Medication Sig Dispense Refill  . acetaminophen (TYLENOL) 500 MG tablet Take 1,000 mg by mouth every 6 (six) hours as needed for moderate pain or headache.    . citalopram (CELEXA) 20 MG tablet Take 1 tablet (20 mg total) by mouth daily. For depression/anxiety 30 tablet 0  . diphenhydrAMINE (BENADRYL) 25 MG tablet Take 25 mg by mouth every 6 (six) hours as needed for allergies.    Marland Kitchen gabapentin (NEURONTIN) 300 MG capsule Take 1 capsule (300 mg total) by mouth 2 (two) times daily. For mood control 60 capsule 0  . hydrochlorothiazide (MICROZIDE) 12.5 MG capsule Take 1 capsule (12.5 mg total) by mouth daily.  30 capsule 0  . ibuprofen (ADVIL,MOTRIN) 200 MG tablet Take 200 mg by mouth every 6 (six) hours as needed for fever or moderate pain.    Marland Kitchen loratadine (CLARITIN) 10 MG tablet Take 1 tablet (10 mg total) by mouth daily. For allergies.    . metoprolol (LOPRESSOR) 50 MG tablet Take 1 tablet (50 mg total) by mouth 2 (two) times daily. 60 tablet 0  . mirtazapine (REMERON) 15 MG tablet Take 1 tablet (15 mg total) by mouth at bedtime. 30 tablet 0  . traZODone (DESYREL) 50 MG tablet Take 1 tablet (50 mg total) by mouth at bedtime as needed for sleep (May repeat X1). 30 tablet 0  . LORazepam (ATIVAN) 0.5 MG tablet Take 1 tablet (0.5 mg total) by mouth 3 (three) times daily. (Patient not taking: Reported on 10/14/2014) 90 tablet 0     Musculoskeletal: Strength & Muscle Tone: within normal limits Gait & Station: normal Patient leans: N/A  Psychiatric Specialty Exam: Physical Exam  Review of Systems  Constitutional: Negative.   HENT: Negative.   Eyes: Negative.   Respiratory: Negative.   Cardiovascular: Negative.   Gastrointestinal: Negative.   Genitourinary: Negative.   Musculoskeletal: Negative.   Skin: Negative.   Neurological: Negative.   Endo/Heme/Allergies: Negative.   Psychiatric/Behavioral: Positive for depression.    Blood pressure 103/69, pulse 64, temperature 97.9 F (36.6 C), temperature source Oral, resp. rate 18, SpO2 100 %.There is no weight on file to calculate BMI.  General Appearance: Casual  Eye Contact::  Good  Speech:  Normal Rate  Volume:  Normal  Mood:  Depressed  Affect:  Congruent  Thought Process:  Coherent  Orientation:  Full (Time, Place, and Person)  Thought Content:  WDL  Suicidal Thoughts:  No  Homicidal Thoughts:  No  Memory:  Immediate;   Good Recent;   Good Remote;   Good  Judgement:  Good  Insight:  Fair  Psychomotor Activity:  Normal  Concentration:  Good  Recall:  Good  Fund of Knowledge:Good  Language: Good  Akathisia:  No  Handed:  Right  AIMS (if indicated):     Assets:  Housing Leisure Time Physical Health Resilience Social Support  ADL's:  Intact  Cognition: WNL  Sleep:      Medical Decision Making: Review of Psycho-Social Stressors (1), Review or order clinical lab tests (1) and Review of Medication Regimen & Side Effects (2)  Treatment Plan Summary: Daily contact with patient to assess and evaluate symptoms and progress in treatment, Medication management and Plan Discharge home, follow-up with his regular providers at [facility]  Plan:  No evidence of imminent risk to self or others at present.   Disposition: Discharge home, follow-up with his regular providers at [facility]  Waylan Boga, [place] 10/14/2014 9:45 AM Patient  seen face-to-face for psychiatric evaluation, chart reviewed and case discussed with the physician extender and developed treatment plan. Reviewed the information documented and agree with the treatment plan. Corena Pilgrim, MD

## 2014-10-14 NOTE — ED Notes (Signed)
Patient discharged to home.  Left the unit ambulatory and all belongings returned.  He denies thoughts of harm to self or others.  He denies auditory or visual hallucinations.  He does state that he cannot go back to where he was living as there are too many stressors.  He repeatedly states that he needs to be inpatient in a facility where he "can get some help."  He does not elaborate on the help he needs other than to state he believes he needs an MMPI test.  He laughs inappropriately at the comments he makes about the help he needs and what is wrong with him.

## 2014-10-14 NOTE — ED Provider Notes (Signed)
CSN: [id number]     Arrival date & time 10/13/14  2227 History   First MD Initiated Contact with Patient 10/13/14 2309     Chief Complaint  Patient presents with  . Suicidal     (Consider location/radiation/quality/duration/timing/severity/associated sxs/prior Treatment) HPI  Patient has a history of depression and suicide attempts in the past. He states he has tried to hang himself and strangle himself before. Patient was just released from behavioral health after being admitted for a week on May 27. When asked if he was feeling better when he was discharged she states "I was somewhat ready". He is very vague about how he feels. I asked his father if he seemed better and his father states "I can't tell, he hasn't been normal in a long time". States he started feeling suicidal feel hours ago. He states he feels tired and he doesn't feel well aerated complaints of feeling lightheaded. He still asked him if he's tired of living and he says yes. He states tonight he was thinking about slitting his carotid artery with a pair of scissors, or crushing his head with a safe he has in his bedroom, or try to suffocate himself. During the course of our interview he suddenly went to the trash can and started vomiting. He had seemed unable to relate to me that he was nauseated. He has not been having vomiting at home. He denies any diarrhea. Patient is concerned about his blood pressure. Before he went to the hospital last time he was only on Prozac. However when he was discharged this time he was put on citalopram, gabapentin HCTZ, Claritin, metoprolol, Remeron, trazodone, and lorazepam 0.5 mg.  [facility] goes weekly  Past Medical History  Diagnosis Date  . Hypertension    No past surgical history on file. No family history on file. History  Substance Use Topics  . Smoking status: Never Smoker   . Smokeless tobacco: Not on file  . Alcohol Use: Yes     Comment: socially  applying for  disability Lives with his father  Review of Systems  All other systems reviewed and are negative.     Allergies  Pollen extract  Home Medications   Prior to Admission medications   Medication Sig Start Date End Date Taking? Authorizing Provider  acetaminophen (TYLENOL) 500 MG tablet Take 1,000 mg by mouth every 6 (six) hours as needed for moderate pain or headache.   Yes Historical Provider, MD  citalopram (CELEXA) 20 MG tablet Take 1 tablet (20 mg total) by mouth daily. For depression/anxiety 10/10/14  Yes Thermon Leyland, NP  diphenhydrAMINE (BENADRYL) 25 MG tablet Take 25 mg by mouth every 6 (six) hours as needed for allergies.   Yes Historical Provider, MD  gabapentin (NEURONTIN) 300 MG capsule Take 1 capsule (300 mg total) by mouth 2 (two) times daily. For mood control 10/10/14  Yes Thermon Leyland, NP  hydrochlorothiazide (MICROZIDE) 12.5 MG capsule Take 1 capsule (12.5 mg total) by mouth daily. 10/10/14  Yes Thermon Leyland, NP  ibuprofen (ADVIL,MOTRIN) 200 MG tablet Take 200 mg by mouth every 6 (six) hours as needed for fever or moderate pain.   Yes Historical Provider, MD  loratadine (CLARITIN) 10 MG tablet Take 1 tablet (10 mg total) by mouth daily. For allergies. 10/10/14  Yes Thermon Leyland, NP  metoprolol (LOPRESSOR) 50 MG tablet Take 1 tablet (50 mg total) by mouth 2 (two) times daily. 10/10/14  Yes Thermon Leyland, NP  mirtazapine (REMERON) 15 MG tablet Take 1 tablet (15 mg total) by mouth at bedtime. 10/10/14  Yes Thermon Leyland, NP  traZODone (DESYREL) 50 MG tablet Take 1 tablet (50 mg total) by mouth at bedtime as needed for sleep (May repeat X1). 10/10/14  Yes Thermon Leyland, NP  LORazepam (ATIVAN) 0.5 MG tablet Take 1 tablet (0.5 mg total) by mouth 3 (three) times daily. Patient not taking: Reported on 10/14/2014 10/10/14   Thermon Leyland, NP   BP 143/84 mmHg  Pulse 79  Temp(Src) 98.2 F (36.8 C) (Oral)  Resp 20  SpO2 95%  Vital signs normal   Physical Exam  Constitutional: He  is oriented to person, place, and time. He appears well-developed and well-nourished.  Non-toxic appearance. He does not appear ill. No distress.  HENT:  Head: Normocephalic and atraumatic.  Right Ear: External ear normal.  Left Ear: External ear normal.  Nose: Nose normal. No mucosal edema or rhinorrhea.  Mouth/Throat: Oropharynx is clear and moist and mucous membranes are normal. No dental abscesses or uvula swelling.  Eyes: Conjunctivae and EOM are normal. Pupils are equal, round, and reactive to light.  Neck: Normal range of motion and full passive range of motion without pain. Neck supple.  Cardiovascular: Normal rate, regular rhythm and normal heart sounds.  Exam reveals no gallop and no friction rub.   No murmur heard. Pulmonary/Chest: Effort normal and breath sounds normal. No respiratory distress. He has no wheezes. He has no rhonchi. He has no rales. He exhibits no tenderness and no crepitus.  Abdominal: Soft. Normal appearance and bowel sounds are normal. He exhibits no distension. There is no tenderness. There is no rebound and no guarding.  Musculoskeletal: Normal range of motion. He exhibits no edema or tenderness.  Moves all extremities well.   Neurological: He is alert and oriented to person, place, and time. He has normal strength. No cranial nerve deficit.  Skin: Skin is warm, dry and intact. No rash noted. No erythema. No pallor.  Psychiatric: He is slowed.  Flat affect, poor eye contact, slow speech  Nursing note and vitals reviewed.   ED Course  Procedures (including critical care time)  Medications  citalopram (CELEXA) tablet 20 mg (not administered)  gabapentin (NEURONTIN) capsule 300 mg (not administered)  hydrochlorothiazide (MICROZIDE) capsule 12.5 mg (not administered)  metoprolol tartrate (LOPRESSOR) tablet 50 mg (not administered)  mirtazapine (REMERON) tablet 15 mg (not administered)  traZODone (DESYREL) tablet 50 mg (not administered)  loratadine  (CLARITIN) tablet 10 mg (not administered)  LORazepam (ATIVAN) tablet 0.5 mg (not administered)  LORazepam (ATIVAN) tablet 1 mg (not administered)  acetaminophen (TYLENOL) tablet 650 mg (not administered)  ibuprofen (ADVIL,MOTRIN) tablet 600 mg (not administered)  zolpidem (AMBIEN) tablet 10 mg (not administered)  nicotine (NICODERM CQ - dosed in mg/24 hours) patch 21 mg (not administered)  ondansetron (ZOFRAN) tablet 4 mg (not administered)  alum & mag hydroxide-simeth (MAALOX/MYLANTA) 200-200-20 MG/5ML suspension 30 mL (not administered)  ondansetron (ZOFRAN-ODT) disintegrating tablet 8 mg (8 mg Oral Given 10/13/14 2328)   Pt given zofran for his vomiting. Psych holding and home meds were written.   00:27 Ivy, TSS will evaluate patient.   01:02 Ivy, TSS has evaluated patient and recommends inpatient admission to longer care facility than [facility] were he was admitted last week.   Pt has been voluntary admission so far.   Labs Review Results for orders placed or performed during the hospital encounter of 10/13/14  Acetaminophen level  Result  Value Ref Range   Acetaminophen (Tylenol), Serum <10 (L) 10 - 30 ug/mL  CBC  Result Value Ref Range   WBC 9.9 4.0 - 10.5 K/uL   RBC 4.92 4.22 - 5.81 MIL/uL   Hemoglobin 15.0 13.0 - 17.0 g/dL   HCT 40.944.9 81.139.0 - 91.452.0 %   MCV 91.3 78.0 - 100.0 fL   MCH 30.5 26.0 - 34.0 pg   MCHC 33.4 30.0 - 36.0 g/dL   RDW 78.213.0 95.611.5 - 21.315.5 %   Platelets 330 150 - 400 K/uL  Comprehensive metabolic panel  Result Value Ref Range   Sodium 139 135 - 145 mmol/L   Potassium 4.2 3.5 - 5.1 mmol/L   Chloride 105 101 - 111 mmol/L   CO2 24 22 - 32 mmol/L   Glucose, Bld 113 (H) 65 - 99 mg/dL   BUN 15 6 - 20 mg/dL   Creatinine, Ser 0.860.93 0.61 - 1.24 mg/dL   Calcium 9.4 8.9 - 57.810.3 mg/dL   Total Protein 7.3 6.5 - 8.1 g/dL   Albumin 4.1 3.5 - 5.0 g/dL   AST 44 (H) 15 - 41 U/L   ALT 77 (H) 17 - 63 U/L   Alkaline Phosphatase 67 38 - 126 U/L   Total Bilirubin 0.7 0.3 - 1.2  mg/dL   GFR calc non Af Amer >60 >60 mL/min   GFR calc Af Amer >60 >60 mL/min   Anion gap 10 5 - 15  Ethanol (ETOH)  Result Value Ref Range   Alcohol, Ethyl (B) <5 <5 mg/dL  Salicylate level  Result Value Ref Range   Salicylate Lvl <4.0 2.8 - 30.0 mg/dL  Urine Drug Screen  Result Value Ref Range   Opiates NONE DETECTED NONE DETECTED   Cocaine NONE DETECTED NONE DETECTED   Benzodiazepines NONE DETECTED NONE DETECTED   Amphetamines NONE DETECTED NONE DETECTED   Tetrahydrocannabinol NONE DETECTED NONE DETECTED   Barbiturates NONE DETECTED NONE DETECTED   Laboratory interpretation all normal    Imaging Review No results found.   EKG Interpretation None      MDM   Final diagnoses:  Suicidal ideation  Depression  Nausea and vomiting, vomiting of unspecified type    Plan admission to inpatient psychiatric facility  Devoria AlbeIva Wahneta Derocher, MD, Concha PyoFACEP     Kohl Polinsky, MD 10/14/14 [phone]

## 2014-10-14 NOTE — BH Assessment (Signed)
[facility] Assessment Progress Note  At 10:58 pt's father, Viviana SimplerRobert Stipe, called.  [facility]-an-hour.  Pt's nurse, Dawnaly, has been notified.  Doylene Canninghomas Vinton Layson, MA Triage Specialist [phone]

## 2014-10-14 NOTE — BH Assessment (Signed)
Assessment Note  Brent HorsfallSeth Paolucci is an 29 y.o. male, who [facility].  The Patient presented orientated x4 , mood "depressed and sad", affect congruent with mood and tearful.  The Patient denied current SI, however he reports last episode was at ED admission and that he currently feels "safe."  The Patient reports prior to and at admission he experiencing was SI with plans of hanging himself, cutting an artery in his neck, smothering himself with a pillow, and/or crushing his head with a safe.  The Patient denied AVH and HI.  He reports chronically experiencing SI since the age of 10 years.  The Patient reports sleeping 8 to 9 hours with a prescribed sleep aid and over eating since [facility].  The Patient reports being med compliant with all [facility]s [facility] since [facility].  He denied any illicit substance use and reports consuming 1 beer 3 weeks ago.  The Patient reports currently seeing a Visual merchandiserTherapist and [facility].  The Patient reports wanting longer term inpatient treatment.  The Patient Father was called with his consent.  Patient Father reports several stressors the Patient has been experiencing recently.  The Patient Father reports Patient's Sister, who lives in the home, has been sick recently, his Brother separated from his Spouse, and the Patient's self employment has not been working out as planned.  The Patient's Father reports the Patient has difficulty managing stress.  The Patient Father reports to his knowledge the Patient has been med compliant with [facility].       Axis I: Depressive Disorder NOS Axis II: Deferred Axis IV: problems related to social environment Axis V: 11-20 some danger of hurting self or others possible OR occasionally fails to maintain minimal personal hygiene OR gross impairment in communication  Past Medical History:  Past Medical History  Diagnosis Date   . Hypertension     No past surgical history on file.  Family History: No family history on file.  Social History:  reports that he has never smoked. He does not have any smokeless tobacco history on file. He reports that he drinks alcohol. He reports that he does not use illicit drugs.  Additional Social History:     CIWA: CIWA-Ar BP: 108/72 mmHg Pulse Rate: 65 COWS:    Allergies:  Allergies  Allergen Reactions  . Pollen Extract     Home Medications:  (Not in a hospital admission)  OB/GYN Status:  No LMP for male patient.  General Assessment Data [facility] of [facility]: [facility] Assessment: In system Is this a Tele or Face-to-Face Assessment?: Face-to-Face Is this an Initial Assessment or a Re-assessment for this encounter?: Initial Assessment Marital status: Single Maiden name: N/A Is patient pregnant?: No Pregnancy Status: No Living Arrangements: Parent (Mother, Father, and Sister) Can pt return to current living arrangement?: Yes Admission Status: Voluntary Is patient capable of signing voluntary admission?: Yes Referral Source: Self/Family/Friend Insurance type: Self Pay  Medical Screening Exam [facility] Medical Exam completed: Yes  Crisis Care Plan Living Arrangements: Parent (Mother, Father, and Sister) Name of Psychiatrist: Dr. Manuela NeptuneAnn Bailey Name of Therapist: Thyra BreedHarry Suggs  Education Status Is patient currently in school?: No Current Grade: N/A Highest grade of school patient has completed: High School Name of school: N/A Contact person: N/A  Risk to self with the past 6 months Suicidal Ideation: No-Not Currently/Within Last 6 Months Has patient been  a risk to self within the past 6 months prior to admission? : Yes Suicidal Intent: No-Not Currently/Within Last 6 Months Has patient had any suicidal intent within the past 6 months prior to admission? : Yes Is patient at risk for suicide?: Yes Suicidal Plan?: No-Not Currently/Within Last 6  Months Has patient had any suicidal plan within the past 6 months prior to admission? : Yes Specify Current Suicidal Plan: Hang, cut, smother, crush self Access to Means: No What has been your use of drugs/alcohol within the last 12 months?: Patient denied Previous Attempts/Gestures: Yes How many times?: 1 Other Self Harm Risks: None Triggers for Past Attempts: Other (Comment) (Depression) Intentional Self Injurious Behavior: None Family Suicide History: Unknown Recent stressful life event(s): Other (Comment) (Reports chronic depression) Persecutory voices/beliefs?: No Depression: Yes Depression Symptoms: Despondent, Tearfulness, Feeling worthless/self pity, Loss of interest in usual pleasures, Isolating Substance abuse history and/or treatment for substance abuse?: No Suicide prevention information given to non-admitted patients: Not applicable  Risk to Others within the past 6 months Homicidal Ideation: No Does patient have any lifetime risk of violence toward others beyond the six months prior to admission? : No Thoughts of Harm to Others: No Current Homicidal Intent: No Current Homicidal Plan: No Access to Homicidal Means: No Identified Victim: N/A History of harm to others?: No Assessment of Violence: None Noted Violent Behavior Description: N/A Does patient have access to weapons?: No (Patient denied any guns in the home) Criminal Charges Pending?: No Does patient have a court date: No Is patient on probation?: No  Psychosis Hallucinations: None noted Delusions: None noted  Mental Status Report Appearance/Hygiene: In scrubs Eye Contact: Fair Motor Activity: Unremarkable Speech: Logical/coherent Level of Consciousness: Alert Mood: Depressed, Anxious, Sad Affect: Anxious, Depressed, Sad Anxiety Level: Moderate Thought Processes: Coherent, Relevant Judgement: Impaired Orientation: Person, Place, Time, Situation Obsessive Compulsive Thoughts/Behaviors:  None  Cognitive Functioning Concentration: Decreased Memory: Remote Intact, Recent Intact IQ: Average Insight: Poor Impulse Control: Poor Appetite: Good Weight Loss: 0 Weight Gain: 0 Sleep: Increased Total Hours of Sleep: 9 Vegetative Symptoms: None  ADLScreening [facility] Assessment Services) Patient's cognitive ability adequate to safely complete daily activities?: Yes Patient able to express need for assistance with ADLs?: Yes Independently performs ADLs?: Yes (appropriate for developmental age)  Prior Inpatient Therapy Prior Inpatient Therapy: Yes Prior Therapy Dates: 10-03-2014 to 10-10-2014 Prior Therapy Facilty/Provider(s): [facility] Reason for Treatment: SI and depression  Prior Outpatient Therapy Prior Outpatient Therapy: Yes Prior Therapy Dates: Currently Prior Therapy Facilty[facility]Provider(s): [facility] [place] Reason for Treatment: Depression and SI Does patient have an ACCT team?: No Does patient have Intensive In-House Services?  : No Does patient have [facility] services? : No Does patient have P4CC services?: No  ADL Screening (condition at time of admission) Patient's cognitive ability adequate to safely complete daily activities?: Yes Is the patient deaf or have difficulty hearing?: No Does the patient have difficulty seeing, even when wearing glasses/contacts?: No Does the patient have difficulty concentrating, remembering, or making decisions?: No Patient able to express need for assistance with ADLs?: Yes Does the patient have difficulty dressing or bathing?: No Independently performs ADLs?: Yes (appropriate for developmental age) Does the patient have difficulty walking or climbing stairs?: No Weakness of Legs: None Weakness of Arms/Hands: None  Home Assistive Devices/Equipment Home Assistive Devices/Equipment: None    Abuse/Neglect Assessment (Assessment to be complete while patient is alone) Physical Abuse: Denies Verbal Abuse: Denies Sexual  Abuse: Denies Exploitation of patient/patient's resources: Denies Self-Neglect: Denies  Values / Beliefs Cultural Requests During Hospitalization: None Spiritual Requests During Hospitalization: None   Advance Directives (For Healthcare) Does patient have an advance directive?: No Would patient like information on creating an advanced directive?: No - patient declined information    Additional Information 1:1 In Past 12 Months?: No CIRT Risk: No Elopement Risk: No Does patient have medical clearance?: Yes     Disposition:  Disposition Initial Assessment Completed for this Encounter: Yes Disposition of Patient: Inpatient treatment program Type of inpatient treatment program: Adult  On Site Evaluation by:   Reviewed with Physician:    Dey-Johnson,Gabriell Daigneault 10/14/2014 1:26 AM

## 2014-10-14 NOTE — ED Notes (Cosign Needed)
Pt. Noted in room. No complaints or concerns voiced. No distress or abnormal behavior noted. Will continue to monitor with security cameras. Q 15 minute rounds continue. 

## 2015-04-12 IMAGING — CR DG CHEST 2V
1 series · 2 of 2 positions shown · non-contrast
Comparison: None.

CLINICAL DATA: Anxiety.

EXAM:
CHEST  2 VIEW

[Series 1: w chest pa · 0.14mm/px · 2 of 2 slices shown]
[im 1/2]
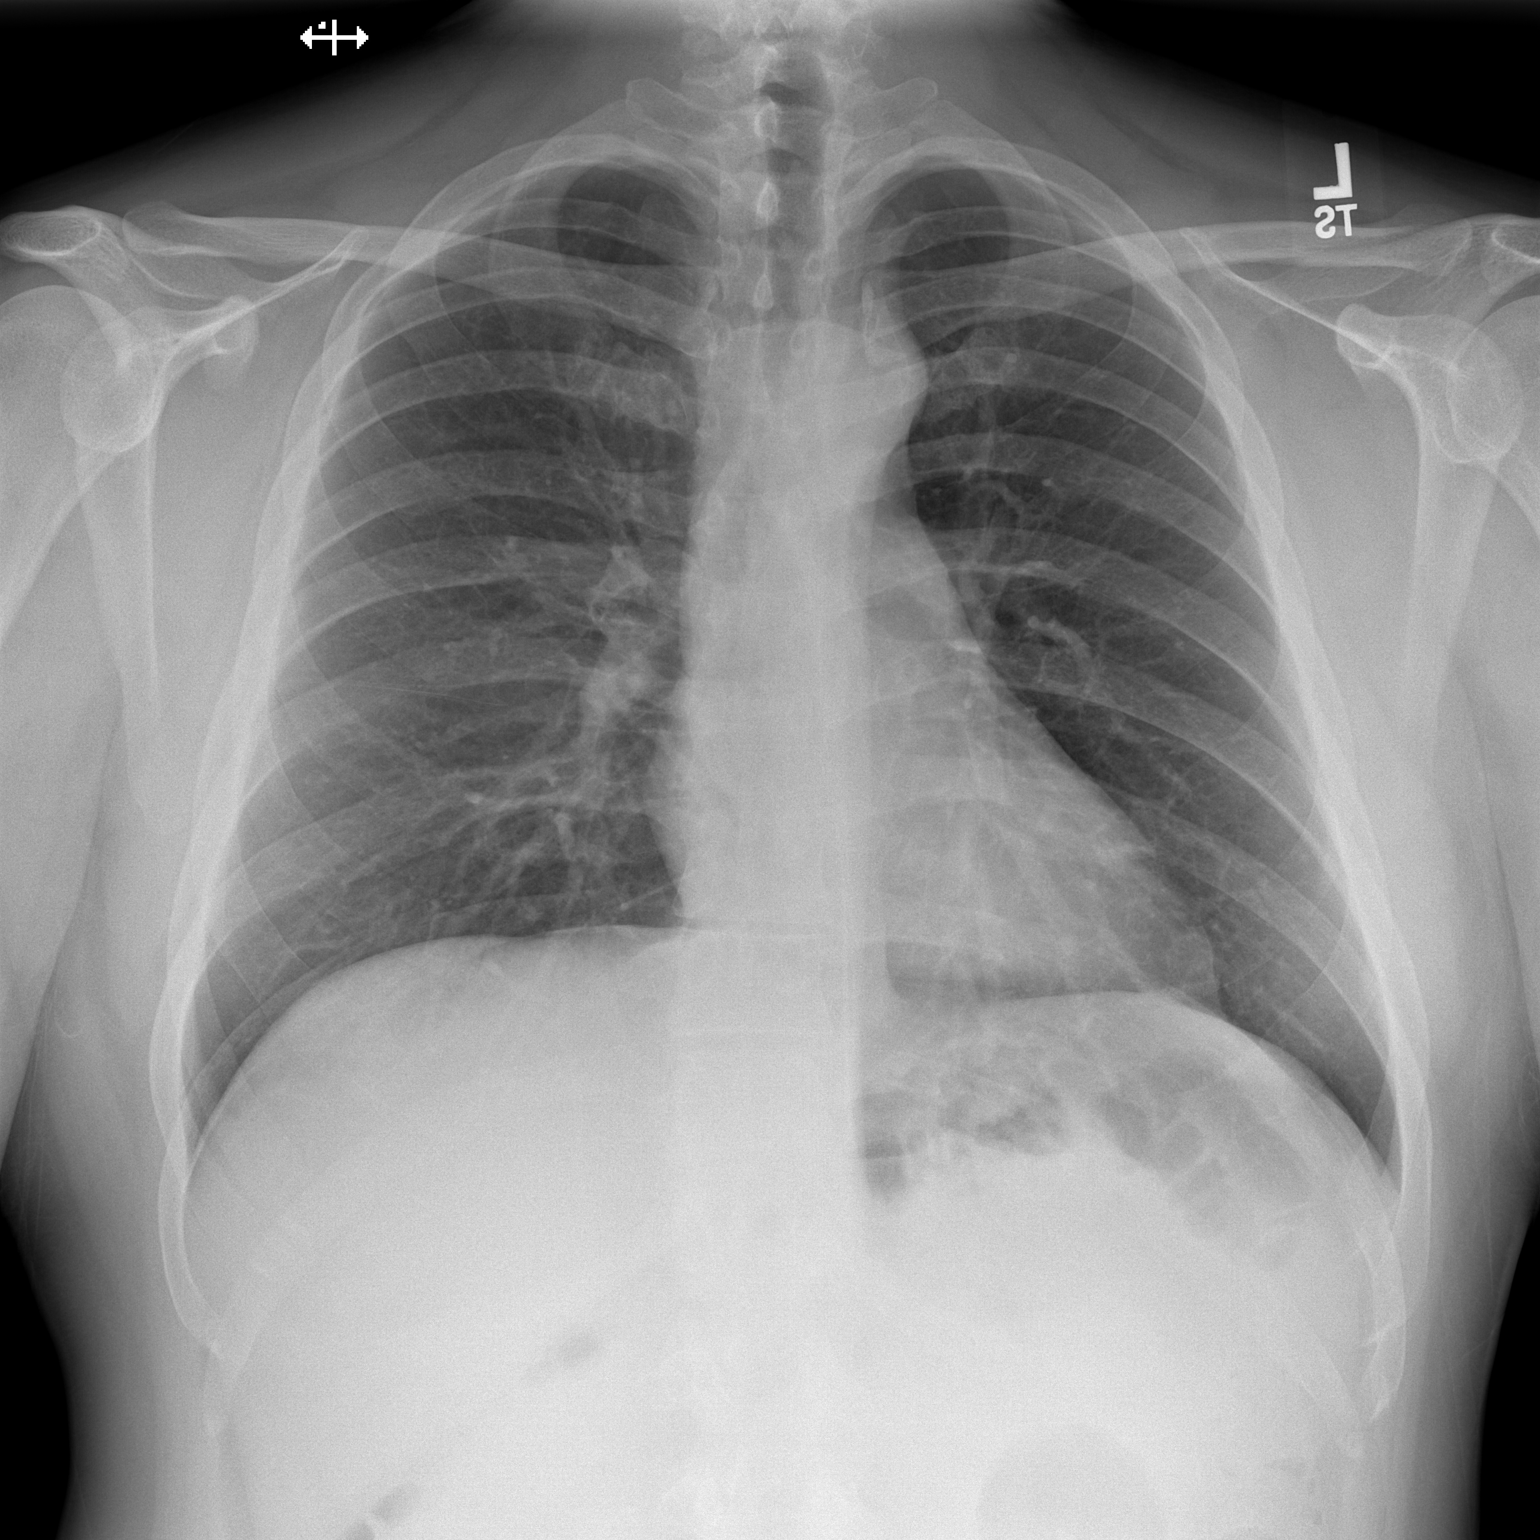
[im 2/2]
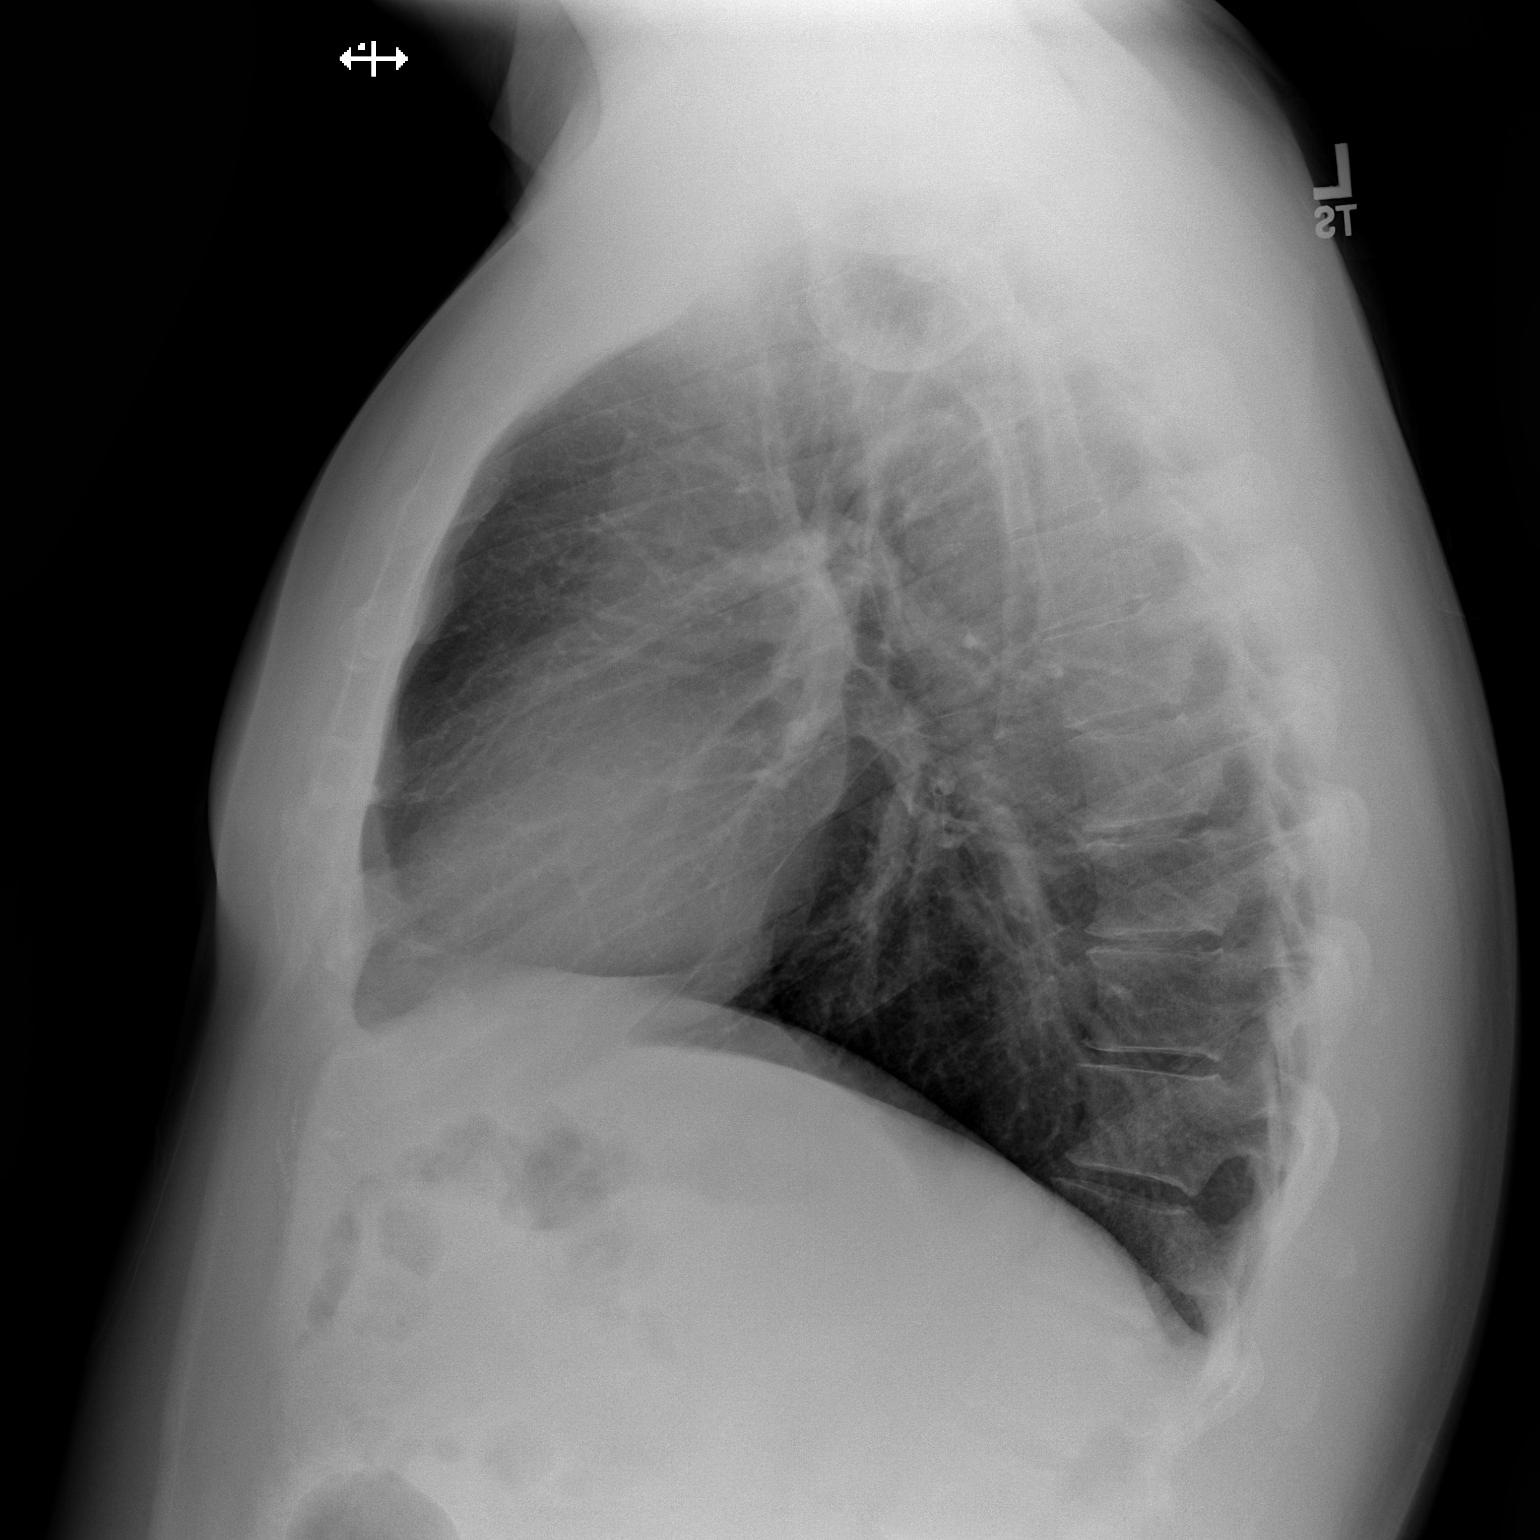

[2 of 2 positions shown; findings below may reference images not displayed]

FINDINGS: The heart size and mediastinal contours are within normal limits.
Both lungs are clear. Mild blunting of the posterior costophrenic
sulci is likely from large lung volumes. The visualized skeletal
structures are unremarkable.
IMPRESSION: No active cardiopulmonary disease.

## 2016-01-01 ENCOUNTER — Encounter (HOSPITAL_COMMUNITY): Payer: Self-pay | Admitting: Oncology

## 2016-01-01 ENCOUNTER — Emergency Department (HOSPITAL_COMMUNITY)
Admission: EM | Admit: 2016-01-01 | Discharge: 2016-01-02 | Disposition: A | Discharge status: Other Acute Inpatient Hospital | Payer: Self-pay | Attending: Emergency Medicine | Admitting: Emergency Medicine

## 2016-01-01 DIAGNOSIS — R45851 Suicidal ideations: Secondary | ICD-10-CM | POA: Insufficient documentation

## 2016-01-01 DIAGNOSIS — F32A Depression, unspecified: Secondary | ICD-10-CM

## 2016-01-01 DIAGNOSIS — I10 Essential (primary) hypertension: Secondary | ICD-10-CM | POA: Insufficient documentation

## 2016-01-01 DIAGNOSIS — Z791 Long term (current) use of non-steroidal anti-inflammatories (NSAID): Secondary | ICD-10-CM | POA: Insufficient documentation

## 2016-01-01 DIAGNOSIS — F329 Major depressive disorder, single episode, unspecified: Secondary | ICD-10-CM | POA: Insufficient documentation

## 2016-01-01 DIAGNOSIS — Z79899 Other long term (current) drug therapy: Secondary | ICD-10-CM | POA: Insufficient documentation

## 2016-01-01 DIAGNOSIS — F3162 Bipolar disorder, current episode mixed, moderate: Secondary | ICD-10-CM

## 2016-01-01 LAB — COMPREHENSIVE METABOLIC PANEL
ALBUMIN: 5 g/dL (ref 3.5–5.0)
ALK PHOS: 65 U/L (ref 38–126)
ALT: 95 U/L — AB (ref 17–63)
ANION GAP: 10 (ref 5–15)
AST: 43 U/L — AB (ref 15–41)
BILIRUBIN TOTAL: 1 mg/dL (ref 0.3–1.2)
BUN: 20 mg/dL (ref 6–20)
CALCIUM: 9.4 mg/dL (ref 8.9–10.3)
CO2: 25 mmol/L (ref 22–32)
Chloride: 101 mmol/L (ref 101–111)
Creatinine, Ser: 1.09 mg/dL (ref 0.61–1.24)
GFR calc Af Amer: >60 mL/min (ref >60)
GFR calc non Af Amer: >60 mL/min (ref >60)
GLUCOSE: 89 mg/dL (ref 65–99)
Potassium: 3.6 mmol/L (ref 3.5–5.1)
SODIUM: 136 mmol/L (ref 135–145)
Total Protein: 8.2 g/dL — ABNORMAL HIGH (ref 6.5–8.1)

## 2016-01-01 LAB — CBC
HCT: 46.8 % (ref 39.0–52.0)
HEMOGLOBIN: 15.7 g/dL (ref 13.0–17.0)
MCH: 30.7 pg (ref 26.0–34.0)
MCHC: 33.5 g/dL (ref 30.0–36.0)
MCV: 91.4 fL (ref 78.0–100.0)
PLATELETS: 351 10*3/uL (ref 150–400)
RBC: 5.12 MIL/uL (ref 4.22–5.81)
RDW: 13 % (ref 11.5–15.5)
WBC: 10.5 10*3/uL (ref 4.0–10.5)

## 2016-01-01 LAB — RAPID URINE DRUG SCREEN, HOSP PERFORMED
Amphetamines: NOT DETECTED
Barbiturates: NOT DETECTED
Benzodiazepines: NOT DETECTED
COCAINE: NOT DETECTED
OPIATES: NOT DETECTED
Tetrahydrocannabinol: NOT DETECTED

## 2016-01-01 LAB — ETHANOL

## 2016-01-01 LAB — ACETAMINOPHEN LEVEL

## 2016-01-01 LAB — SALICYLATE LEVEL

## 2016-01-01 MED ORDER — ONDANSETRON HCL 4 MG PO TABS: 4.0000 mg | ORAL_TABLET | Freq: Three times a day (TID) | ORAL | Status: DC | PRN

## 2016-01-01 MED ORDER — NICOTINE 21 MG/24HR TD PT24: 21.0000 mg | MEDICATED_PATCH | Freq: Every day | TRANSDERMAL | Status: DC

## 2016-01-01 MED ORDER — ACETAMINOPHEN 325 MG PO TABS: 650.0000 mg | ORAL_TABLET | ORAL | Status: DC | PRN

## 2016-01-01 MED ORDER — ALUM & MAG HYDROXIDE-SIMETH 200-200-20 MG/5ML PO SUSP: 30.0000 mL | ORAL | Status: DC | PRN

## 2016-01-01 MED ORDER — IBUPROFEN 200 MG PO TABS
600.0000 mg | ORAL_TABLET | Freq: Three times a day (TID) | ORAL | Status: DC | PRN
  Administered 2016-01-02: 600 mg via ORAL
  Filled 2016-01-01: qty 3

## 2016-01-01 MED ORDER — LORAZEPAM 1 MG PO TABS
1.0000 mg | ORAL_TABLET | Freq: Three times a day (TID) | ORAL | Status: DC | PRN
  Administered 2016-01-02 (×2): 1 mg via ORAL
  Filled 2016-01-01 (×2): qty 1

## 2016-01-01 MED ORDER — ZOLPIDEM TARTRATE 5 MG PO TABS: 5.0000 mg | ORAL_TABLET | Freq: Every evening | ORAL | Status: DC | PRN
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
## 2016-01-01 NOTE — ED Triage Notes (Signed)
Pt reports a hx of chronic SI.  States that, "This is as bad as it has ever been."  "I need help, please don't send me away."  Pt states he is suicidal w/ multiple plans that he will not elaborate on.  States he has been dealing w/, "Family drama."  Which has contributed to his SI.  Pt is calm and cooperative at this time.

## 2016-01-01 NOTE — ED Provider Notes (Signed)
[facility]ENCY DEPT Provider Note   CSN: [id number] Arrival date & time: 01/01/16  1907     History   Chief Complaint Chief Complaint  Patient presents with  . Suicidal    HPI Brent Rios is a 30 y.o. male.  HPI Brent Rios is a 30 y.o. male with hx of hypertension and chronic depression, presents to ED with SI. Pt states he has had depression and multiple psychiatric admissions, mainly at [facility], for SI. States that symptoms worsened over the last week. States he has a plan, states his plan is "I am going to explode." Today he called crisis line and was taken [facility]. There he told them that "I am going to explode, I need to be in isolation room otherwise someone or myself will get hurt." Pt was transferred here. Pt states he is taking all his medications. No recent triggers. States this is the worst he has felt in many years. Last inpatient admission in June. States he does not drink or do drugs.   Past Medical History:  Diagnosis Date  . Hypertension     Patient Active Problem List   Diagnosis Date Noted  . 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 Suicidal ideation   . Major depressive disorder, recurrent, severe without psychotic features (HCC)   . Generalized anxiety disorder   . Major depressive disorder, recurrent episode, severe (HCC) 10/04/2014    History reviewed. No pertinent surgical history.     Home Medications    Prior to Admission medications   Medication Sig Start Date End Date Taking? Authorizing Provider  acetaminophen (TYLENOL) 500 MG tablet Take 1,000 mg by mouth every 6 (six) hours as needed for moderate pain or headache.   Yes Historical Provider, MD  albuterol (PROVENTIL HFA;VENTOLIN HFA) 108 (90 Base) MCG/ACT inhaler Inhale into the lungs. 10/08/15 10/07/16 Yes Historical Provider, MD  citalopram (CELEXA) 40 MG tablet Take 40 mg by mouth daily.  10/08/15  Yes Historical Provider, MD  fluticasone  (FLONASE) 50 MCG/ACT nasal spray 1 spray by Each Nare route daily. 10/08/15 10/07/16 Yes Historical Provider, MD  hydrochlorothiazide (HYDRODIURIL) 12.5 MG tablet Take 12.5 mg by mouth daily.  10/08/15  Yes Historical Provider, MD  ibuprofen (ADVIL,MOTRIN) 200 MG tablet Take 400 mg by mouth every 6 (six) hours as needed for fever or moderate pain.    Yes Historical Provider, MD  Ibuprofen-Diphenhydramine HCl (ADVIL PM) 200-25 MG CAPS Take 2 capsules by mouth at bedtime as needed (sleep).   Yes Historical Provider, MD  loratadine (CLARITIN) 10 MG tablet Take 10 mg by mouth daily.  10/08/15 10/07/16 Yes Historical Provider, MD  Pseudoeph-Doxylamine-DM-APAP (NYQUIL PO) Take 30 mLs by mouth at bedtime as needed (cold symptoms).   Yes Historical Provider, MD  traZODone (DESYREL) 100 MG tablet Take 100 mg by mouth at bedtime.  10/08/15  Yes Historical Provider, MD  citalopram (CELEXA) 20 MG tablet Take 1 tablet (20 mg total) by mouth daily. For depression/anxiety Patient not taking: Reported on 01/01/2016 10/10/14   Thermon Leyland, NP  gabapentin (NEURONTIN) 300 MG capsule Take 1 capsule (300 mg total) by mouth 2 (two) times daily. For mood control Patient not taking: Reported on 01/01/2016 10/10/14   Thermon Leyland, NP  hydrochlorothiazide (MICROZIDE) 12.5 MG capsule Take 1 capsule (12.5 mg total) by mouth daily. Patient not taking: Reported on 01/01/2016 10/10/14   Thermon Leyland, NP  loratadine (CLARITIN) 10 MG tablet Take 1 tablet (  10 mg total) by mouth daily. For allergies. Patient not taking: Reported on 01/01/2016 10/10/14   Thermon LeylandLaura A Davis, NP  LORazepam (ATIVAN) 0.5 MG tablet Take 1 tablet (0.5 mg total) by mouth 3 (three) times daily. Patient not taking: Reported on 10/14/2014 10/10/14   Thermon LeylandLaura A Davis, NP  metoprolol (LOPRESSOR) 50 MG tablet Take 1 tablet (50 mg total) by mouth 2 (two) times daily. Patient not taking: Reported on 01/01/2016 10/10/14   Thermon LeylandLaura A Davis, NP  mirtazapine (REMERON) 15 MG tablet Take 1  tablet (15 mg total) by mouth at bedtime. Patient not taking: Reported on 01/01/2016 10/10/14   Thermon LeylandLaura A Davis, NP  traZODone (DESYREL) 50 MG tablet Take 1 tablet (50 mg total) by mouth at bedtime as needed for sleep (May repeat X1). Patient not taking: Reported on 01/01/2016 10/10/14   Thermon LeylandLaura A Davis, NP    Family History No family history on file.  Social History Social History  Substance Use Topics  . Smoking status: Never Smoker  . Smokeless tobacco: Never Used  . Alcohol use Yes     Comment: socially     Allergies   Pollen extract   Review of Systems Review of Systems  Constitutional: Negative for chills and fever.  Respiratory: Negative for cough, chest tightness and shortness of breath.   Cardiovascular: Negative for chest pain, palpitations and leg swelling.  Gastrointestinal: Negative for abdominal distention, abdominal pain, diarrhea, nausea and vomiting.  Musculoskeletal: Negative for arthralgias, myalgias, neck pain and neck stiffness.  Skin: Negative for rash.  Allergic/Immunologic: Negative for immunocompromised state.  Neurological: Negative for dizziness, weakness, light-headedness, numbness and headaches.  Psychiatric/Behavioral: Positive for agitation, dysphoric mood and suicidal ideas. The patient is nervous/anxious.   All other systems reviewed and are negative.    Physical Exam Updated Vital Signs BP (!) 142/102 (BP Location: Right Arm)   Pulse 89   Temp 98.9 F (37.2 C) (Oral)   Resp 18   SpO2 98%   Physical Exam  Constitutional: He appears well-developed and well-nourished. No distress.  HENT:  Head: Normocephalic and atraumatic.  Eyes: Conjunctivae are normal.  Neck: Neck supple.  Cardiovascular: Normal rate, regular rhythm and normal heart sounds.   Pulmonary/Chest: Effort normal. No respiratory distress. He has no wheezes. He has no rales.  Abdominal: Soft. Bowel sounds are normal. He exhibits no distension. There is no tenderness. There is  no rebound.  Musculoskeletal: He exhibits no edema.  Neurological: He is alert.  Skin: Skin is warm and dry.  Psychiatric: He has a normal mood and affect. His behavior is normal.  Nursing note and vitals reviewed.    ED Treatments / Results  Labs (all labs ordered are listed, but only abnormal results are displayed) Labs Reviewed  COMPREHENSIVE METABOLIC PANEL  ETHANOL  SALICYLATE LEVEL  ACETAMINOPHEN LEVEL  CBC  URINE RAPID DRUG SCREEN, HOSP PERFORMED    EKG  EKG Interpretation None       Radiology No results found.  Procedures Procedures (including critical care time)  Medications Ordered in ED Medications  nicotine (NICODERM CQ - dosed in mg/24 hours) patch 21 mg (not administered)  zolpidem (AMBIEN) tablet 5 mg (not administered)  ibuprofen (ADVIL,MOTRIN) tablet 600 mg (not administered)  LORazepam (ATIVAN) tablet 1 mg (not administered)  acetaminophen (TYLENOL) tablet 650 mg (not administered)  ondansetron (ZOFRAN) tablet 4 mg (not administered)  alum & mag hydroxide-simeth (MAALOX/MYLANTA) 200-200-20 MG/5ML suspension 30 mL (not administered)     Initial Impression / Assessment  and Plan / ED Course  I have reviewed the triage vital signs and the nursing notes.  Pertinent labs & imaging results that were available during my care of the patient were reviewed by me and considered in my medical decision making (see chart for details).  Clinical Course   Pt with suicidal thoughts. Medically cleared. Calm and cooperative. Pending TTS assessment.   Final Clinical Impressions(s) / ED Diagnoses   Final diagnoses:  None    New Prescriptions New Prescriptions   No medications on file     Jaynie Crumbleatyana Eloni Darius, PA-C 01/02/16 0047    Bethann BerkshireJoseph Zammit, MD 01/02/16 [phone]

## 2016-01-01 NOTE — ED Notes (Signed)
[facility]

## 2016-01-02 ENCOUNTER — Encounter (HOSPITAL_COMMUNITY): Payer: Self-pay

## 2016-01-02 ENCOUNTER — Inpatient Hospital Stay (HOSPITAL_COMMUNITY)
Admission: AD | Admit: 2016-01-02 | Discharge: 2016-01-08 | DRG: 885 | Disposition: A | Discharge status: Home / Self Care | Payer: Federal, State, Local not specified - Other | Source: Intra-hospital | Attending: Psychiatry | Admitting: Psychiatry

## 2016-01-02 DIAGNOSIS — F3162 Bipolar disorder, current episode mixed, moderate: Secondary | ICD-10-CM | POA: Diagnosis present

## 2016-01-02 DIAGNOSIS — G4700 Insomnia, unspecified: Secondary | ICD-10-CM | POA: Diagnosis present

## 2016-01-02 DIAGNOSIS — F411 Generalized anxiety disorder: Secondary | ICD-10-CM | POA: Diagnosis present

## 2016-01-02 DIAGNOSIS — F332 Major depressive disorder, recurrent severe without psychotic features: Secondary | ICD-10-CM | POA: Diagnosis present

## 2016-01-02 DIAGNOSIS — I10 Essential (primary) hypertension: Secondary | ICD-10-CM | POA: Diagnosis present

## 2016-01-02 DIAGNOSIS — F410 Panic disorder [episodic paroxysmal anxiety] without agoraphobia: Secondary | ICD-10-CM | POA: Diagnosis present

## 2016-01-02 DIAGNOSIS — F3160 Bipolar disorder, current episode mixed, unspecified: Secondary | ICD-10-CM | POA: Diagnosis not present

## 2016-01-02 DIAGNOSIS — Z818 Family history of other mental and behavioral disorders: Secondary | ICD-10-CM

## 2016-01-02 DIAGNOSIS — R45851 Suicidal ideations: Secondary | ICD-10-CM | POA: Diagnosis present

## 2016-01-02 MED ORDER — ALUM & MAG HYDROXIDE-SIMETH 200-200-20 MG/5ML PO SUSP
30.0000 mL | ORAL | Status: DC | PRN
  Administered 2016-01-04 (×2): 30 mL via ORAL
  Filled 2016-01-02 (×2): qty 30

## 2016-01-02 MED ORDER — ARIPIPRAZOLE 10 MG PO TABS
10.0000 mg | ORAL_TABLET | Freq: Every day | ORAL | Status: DC
  Administered 2016-01-02: 10 mg via ORAL
  Filled 2016-01-02: qty 1

## 2016-01-02 MED ORDER — DIPHENHYDRAMINE HCL 50 MG PO CAPS
50.0000 mg | ORAL_CAPSULE | Freq: Every evening | ORAL | Status: DC | PRN
  Administered 2016-01-02 – 2016-01-05 (×7): 50 mg via ORAL
  Filled 2016-01-02 (×8): qty 1
  Filled 2016-01-02: qty 2
  Filled 2016-01-02: qty 1
  Filled 2016-01-02 (×2): qty 2

## 2016-01-02 MED ORDER — TRAZODONE HCL 100 MG PO TABS: 100.0000 mg | ORAL_TABLET | Freq: Every evening | ORAL | Status: DC | PRN

## 2016-01-02 MED ORDER — IBUPROFEN 600 MG PO TABS
600.0000 mg | ORAL_TABLET | Freq: Three times a day (TID) | ORAL | Status: DC | PRN
  Administered 2016-01-03 – 2016-01-06 (×4): 600 mg via ORAL
  Filled 2016-01-02 (×4): qty 1

## 2016-01-02 MED ORDER — ONDANSETRON HCL 4 MG PO TABS
4.0000 mg | ORAL_TABLET | Freq: Three times a day (TID) | ORAL | Status: DC | PRN
  Administered 2016-01-05: 4 mg via ORAL
  Filled 2016-01-02: qty 1

## 2016-01-02 MED ORDER — NICOTINE 21 MG/24HR TD PT24
21.0000 mg | MEDICATED_PATCH | Freq: Every day | TRANSDERMAL | Status: DC
  Filled 2016-01-02 (×3): qty 1

## 2016-01-02 MED ORDER — ACETAMINOPHEN 325 MG PO TABS
650.0000 mg | ORAL_TABLET | ORAL | Status: DC | PRN
  Administered 2016-01-03 – 2016-01-08 (×7): 650 mg via ORAL
  Filled 2016-01-02 (×7): qty 2

## 2016-01-02 MED ORDER — TRAZODONE HCL 50 MG PO TABS: 50.0000 mg | ORAL_TABLET | Freq: Every evening | ORAL | Status: DC | PRN

## 2016-01-02 NOTE — Progress Notes (Signed)
Patient ID: Lutricia HorsfallSeth Recker, male   DOB: 10/24/1985, 30 y.o.   MRN: [id number] D: Patient observed watching TV and interacting well with peers. Mood and affect is depressed and anxious. [facility]. Denies SI/HI/AVH and pain.No behavioral issues noted.  A: Support and encouragement offered as needed. Medications administered as prescribed.  R: Patient cooperative and appropriate on unit. Will continue to monitor patient for safety and stability.
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
## 2016-01-02 NOTE — ED Notes (Signed)
Pt sleeping; Ativan effective.

## 2016-01-02 NOTE — ED Notes (Signed)
Attempted to call report. Admission nurse to return call.
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
## 2016-01-02 NOTE — Progress Notes (Signed)
01/02/16 1402:  LRT went to pt room to introduce self.  Pt was lying down but awake.  Pt expressed he may be interested in some activities later on.  LRT told pt to let her know if he changes he mind.  Pt was receptive.  Brent RancherMarjette Catherine Cubero, LRT/CTRS

## 2016-01-02 NOTE — ED Notes (Signed)
Patient arrived to unit, pleasant and cooperative with care. Pt requesting to have a shower so he could attempt to "rest better". Pt denies SI/HI or plans to harm himself. Pt states he has not slept in over a day and is exhausted. No s/s of distress noted as assessment.

## 2016-01-02 NOTE — ED Notes (Signed)
Patient given meal and soda on request. Pt then began to make bizarre statements such as "I am literally living in a Total Recall and can see things way before they happen. If I don't get sleep, I have to relive and relive things over and over again on repeat". Pt smiling while talking to staff members. No distress noted.

## 2016-01-02 NOTE — ED Notes (Signed)
Pt is anxious with the desire to have long conversations about the details in his mind concerning events in his life. He feels that he has never been able to open up to anyone adequately even though he has outpt providers and multiple psychiatric admissions. This Clinical research associatewriter spoke with him this morning, and sat in the chair in his room, and Dr. Jama Flavorsobos spent about 20 minutes talking with him this morning.

## 2016-01-02 NOTE — Progress Notes (Signed)
The focus of this group is to help patients review their daily goal of treatment and discuss progress on daily workbooks.  Patient attended group and participated in group. Patient states that his goal of the day was to sleep and he did. Patient rated his day as a 6/10

## 2016-01-02 NOTE — Tx Team (Signed)
Initial Interdisciplinary Treatment Plan   PATIENT STRESSORS: Financial difficulties Health problems Legal issue   PATIENT STRENGTHS: Communication skills General fund of knowledge   PROBLEM LIST: Problem List/Patient Goals Date to be addressed Date deferred Reason deferred Estimated date of resolution  Depression 01/02/16     Suicidal ideation 01/02/16     Mood swings 01/02/16     "I would like to get stability with my medications" 01/02/16     "Get back on a better sleep schedule" 01/02/16                              DISCHARGE CRITERIA:  Improved stabilization in mood, thinking, and/or behavior Verbal commitment to aftercare and medication compliance  PRELIMINARY DISCHARGE PLAN: Outpatient therapy Medication management  PATIENT/FAMIILY INVOLVEMENT: This treatment plan has been presented to and reviewed with the patient, Lutricia HorsfallSeth Caris.  The patient and family have been given the opportunity to ask questions and make suggestions.  Norm ParcelHeather V Deepika Decatur 01/02/2016, 7:37 PM

## 2016-01-02 NOTE — ED Notes (Signed)
Pt transported to [facility] by [facility]. All belongings returned to pt who signed for same.  

## 2016-01-02 NOTE — BH Assessment (Signed)
[facility] Assessment Progress Note  [facility]

## 2016-01-02 NOTE — Progress Notes (Signed)
Pt informs CM his pcp is Clinical biochemistanthony steele  EPIC updated

## 2016-01-02 NOTE — BH Assessment (Addendum)
[facility] Assessment Progress Note  Per Nehemiah MassedFernando Cobos, MD, this pt requires psychiatric hospitalization at this time.  Brent Heinrichina Tate, RN, [facility]  Pt has signed Voluntary Admission and Consent for Treatment, as well as Consent to Release Information to his siblings and to [facility], his outpatient provider, and a notification call has been placed to the latter.  [facility]  Pt's nurse, Diane, has been notified, and agrees to send original paperwork along with pt via [name], and to call report to [phone].  Brent Canninghomas Dayshawn Irizarry, MA Triage Specialist [phone]    Addendum:  Per [name], MHT, [facility].  At 13:24 this Clinical research associatewriter called their intake number and spoke to Schering-PloughCrystal.  She reports that they do not have any adult beds at this time.  [name] has been notified.  Brent Canninghomas [name], MA Triage Specialist [phone]

## 2016-01-02 NOTE — ED Notes (Signed)
Pt is awake early in the SAPPU and complaining of head ache. Pt states that he has not felt SI or HI since 5 pm yesterday. He said that he has been admitted to inpatient 7 times in the past. Pt said that he sought help because he was feeling manic and felt that he might hurt someone. Behavior at this time is calm and cooperative.

## 2016-01-03 ENCOUNTER — Encounter (HOSPITAL_COMMUNITY): Payer: Self-pay | Admitting: Psychiatry

## 2016-01-03 DIAGNOSIS — R45851 Suicidal ideations: Secondary | ICD-10-CM

## 2016-01-03 MED ORDER — LORAZEPAM 2 MG/ML IJ SOLN
INTRAMUSCULAR | Status: AC
  Filled 2016-01-03: qty 1

## 2016-01-03 MED ORDER — ARIPIPRAZOLE 5 MG PO TABS
5.0000 mg | ORAL_TABLET | Freq: Every day | ORAL | Status: DC
  Administered 2016-01-03: 5 mg via ORAL
  Filled 2016-01-03 (×3): qty 1

## 2016-01-03 MED ORDER — QUETIAPINE FUMARATE 50 MG PO TABS
ORAL_TABLET | ORAL | Status: AC
  Administered 2016-01-03: 13:00:00
  Filled 2016-01-03: qty 1

## 2016-01-03 MED ORDER — QUETIAPINE FUMARATE 50 MG PO TABS
50.0000 mg | ORAL_TABLET | Freq: Once | ORAL | Status: AC
  Filled 2016-01-03: qty 1

## 2016-01-03 MED ORDER — LORAZEPAM 2 MG/ML IJ SOLN
1.0000 mg | Freq: Once | INTRAMUSCULAR | Status: AC
  Administered 2016-01-03: 1 mg via INTRAMUSCULAR

## 2016-01-03 MED ORDER — LORAZEPAM 1 MG PO TABS
ORAL_TABLET | ORAL | Status: DC
Start: 2016-01-03 — End: 2016-01-03
  Filled 2016-01-03: qty 1
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
## 2016-01-03 NOTE — [facility] Group Notes (Signed)
[facility] Group Notes:  (Clinical Social Work)  01/03/2016    11:00AM-12:00PM  Summary of Progress/Problems:   The main focus of today's process group was to explore in depth the perceived benefits and costs of unhealthy coping techniques, as well as the  benefits and costs of replacing that with a healthy coping skills.  After talking about the possible benefits and definite costs of patients' specific unhealthy coping techniques, the patients were given the opportunity to write a goodbye letter to the unhealthy coping of their choice.  Some patients shared their letters with the group.  It was a tearful group with a lot of mutual support.  The patient expressed a) healthy and b) unhealthy coping techniques including a) prayer and leaving the room if needed as well as b) self-destructive behaviors, to which he added, "I have at least a million, I guarantee you."  Pennie RushingSeth was monopolizing and at times intrusive/distracting with frequent comments to another patient about how  "understandable it is a gun makes you feel safe."  He cried heavily at times, could not read his own letter at the end of group, but did ask CSW to do so.  He laughed a great deal while also crying frequently.  Type of Therapy:  Group Therapy - Process   Participation Level:  Active  Participation Quality:  Intrusive and Monopolizing  Affect:  Depressed, Flat, Not Congruent and Tearful  Cognitive:  Disorganized  Insight:  Improving  Engagement in Therapy:  Engaged  Modes of Intervention:  Exploration, Activity  Ambrose MantleMareida Grossman-Orr, LCSW 01/03/2016, 1:43 PM

## 2016-01-03 NOTE — [facility] Group Notes (Signed)
[facility] Group Notes:  (Nursing/MHT/Case Management/Adjunct)  Date:  01/03/2016  Time:  6:38 PM  Type of Therapy:  Nurse Education  Participation Level:  Active  Participation Quality:  Appropriate  Affect:  Appropriate  Cognitive:  Appropriate  Insight:  Appropriate  Engagement in Group:  Engaged  Modes of Intervention:  Problem-solving  Summary of Progress/Problems: patient's goal today is: "Talk to staff".  Brent Rios G Brent Rios 01/03/2016, 6:38 PM

## 2016-01-03 NOTE — [facility] Suicide Risk Assessment (Signed)
[facility]   Nursing information obtained from:  Patient Demographic factors:  Male, Caucasian Current Mental Status:  NA Loss Factors:  Financial problems / change in socioeconomic status, Decline in physical health Historical Factors:  Prior suicide attempts, Family history of suicide, Family history of mental illness or substance abuse Risk Reduction Factors:  Living with another person, especially a relative  Total Time spent with patient: 30 minutes Principal Problem: Bipolar 1 disorder, mixed (HCC) Diagnosis:   Patient Active Problem List   Diagnosis Date Noted  . Bipolar 1 disorder, mixed, moderate (HCC) [F31.62] 01/02/2016  . Bipolar 1 disorder, mixed (HCC) [F31.60] 01/02/2016  . Suicidal ideation [R45.851]   . Generalized anxiety disorder [F41.1]    Subjective Data: Patient states " I am having mood swings and panic attacks. I need help.'  Continued Clinical Symptoms:  Alcohol Use Disorder Identification Test Final Score (AUDIT): 2 The "Alcohol Use Disorders Identification Test", Guidelines for Use in Primary Care, Second Edition.  World Science writerHealth Organization [facility](WHO). Score between 0-7:  no or low risk or alcohol related problems. Score between 8-15:  moderate risk of alcohol related problems. Score between 16-19:  high risk of alcohol related problems. Score 20 or above:  warrants further diagnostic evaluation for alcohol dependence and treatment.   CLINICAL FACTORS:   Panic Attacks Bipolar Disorder:   Mixed State   Musculoskeletal: Strength & Muscle Tone: within normal limits Gait & Station: normal Patient leans: N/A  Psychiatric Specialty Exam: Physical Exam  Nursing note and vitals reviewed.   Review of Systems  Psychiatric/Behavioral: Positive for depression. The patient is nervous/anxious.   All other systems reviewed and are negative.   Blood pressure 128/88, pulse (!) 113, temperature 97.5 F (36.4 C), resp. rate 12, height 5'  10" (1.778 m), weight 110.9 kg (244 lb 8 oz), SpO2 98 %.Body mass index is 35.08 kg/m.  General Appearance: Disheveled  Eye Contact:  Fair  Speech:  Clear and Coherent  Volume:  Normal  Mood:  Anxious and Depressed  Affect:  Appropriate  Thought Process:  Goal Directed and Descriptions of Associations: Circumstantial  Orientation:  Full (Time, Place, and Person)  Thought Content:  Rumination  Suicidal Thoughts:  Yes.  with intent/plan  Homicidal Thoughts:  No  Memory:  Immediate;   Fair Recent;   Fair Remote;   Fair  Judgement:  Impaired  Insight:  Shallow  Psychomotor Activity:  Restlessness  Concentration:  Concentration: Fair and Attention Span: Fair  Recall:  [employer]Fair  Fund of Knowledge:  Fair  Language:  Fair  Akathisia:  No  Handed:  Right  AIMS (if indicated):     Assets:  Desire for Improvement  ADL's:  Intact  Cognition:  WNL  Sleep:  Number of Hours: 5.75      COGNITIVE FEATURES THAT CONTRIBUTE TO RISK:  Closed-mindedness, Polarized thinking and Thought constriction (tunnel vision)    SUICIDE RISK:   Severe:  Frequent, intense, and enduring suicidal ideation, specific plan, no subjective intent, but some objective markers of intent (i.e., choice of lethal method), the method is accessible, some limited preparatory behavior, evidence of impaired self-control, severe dysphoria/symptomatology, multiple risk factors present, and few if any protective factors, particularly a lack of social support.   PLAN OF CARE: pls see H&P FOR PLAN.  I certify that inpatient services furnished can reasonably be expected to improve the patient's condition.  Keylani Perlstein, MD 01/03/2016, 2:25 PM

## 2016-01-03 NOTE — H&P (Signed)
Psychiatric Admission Assessment Adult  Patient Identification: Brent Rios MRN:  [id number] Date of Evaluation:  01/03/2016 Chief Complaint:  MDD Severe Principal Diagnosis: Bipolar 1 disorder, mixed ([place]) Diagnosis:   Patient Active Problem List   Diagnosis Date Noted  . Bipolar 1 disorder, mixed, moderate ([place]) [F31.62] 01/02/2016  . Bipolar 1 disorder, mixed ([place]) [F31.60] 01/02/2016  . Adjustment disorder with mixed disturbance of emotions and conduct [F43.25] 10/14/2014  . Suicidal ideation [R45.851]   . Major depressive disorder, recurrent, severe without psychotic features ([place]) [F33.2]   . Generalized anxiety disorder [F41.1]   . Major depressive disorder, recurrent episode, severe ([place]) [F33.2] 10/04/2014   Subjective:   Brent Rios is a 30 y.o. male patient admitted with  Depression and suicidal ideations. He is reporting gradually worsening anxiety and depression , with recent onset of suicidal ideations, in the context of severe psychosocial/family stressors .   During the evaluation the patient was observed having a panic attack and not being able to contract for safety. He appeared very anxious, and restless. He reported rapid racing thoughts,, decreased concentration, and and suicidal thoughts during group. He was given Seroquel '50mg'$  po in a single dose, and Ativan '1mg'$  IM in a single dose. Reassessment was completed in 30 minutes after medication administration and patient was calm and cooperative at that time. He was able to contract for safety while on the unit.   HPI:  Patient is a 30 year old single male, lives with sister, who reports history of depression, anxiety. He presented to ED reporting chronic, worsening depression, chronic suicidal ideations,  and feeling he was " about to explode ". He has a prior history of mood disorder, and has had prior psychiatric admissions for depression, anxiety. At this time patient presents with symptoms suggestive of a mixed episode -   Reports symptoms of  depression, sadness, but with concomitant racing thoughts, labile affect. He presents pressured in speech,  Somewhat tangential and with a labile affect at this time. He states he has been sleeping poorly recently. Patient denies any alcohol or drug abuse.   Associated Signs/Symptoms: Depression Symptoms:  depressed mood, anhedonia, insomnia, suicidal thoughts with specific plan, disturbed sleep, erratic appetite, weight stable   (Hypo) Manic Symptoms:   Does not currently endorse  Anxiety Symptoms:  Describes severe anxiety, worry  Psychotic Symptoms:  does not endorse and does not appear internally preoccupied  PTSD Symptoms: Does not endorse   Total Time spent with patient: 45 minutes   Past Psychiatric History- Past Psychiatric History: patient has history of  depression, anxiety, and of a prior psychiatric admission in May/2016. At that time was diagnosed with MDD.  No history of suicide attempts . No history of psychosis. No clear history of prior episodes of mania or mixed episodes endorsed. Patient reports he has been taking Celexa 40 mgrs QDAY. He reports taking Trazodone but this medication had never worked for him "at no dose. I have taken several if not all the anti-depressant medications including Remeron. " Denies side effects. Denies drug or alcohol abuse.   Past Medical History:  States he was told in the past he could have cutaneous lupus, but states it was never formally diagnosed. Does not smoke.  Past Medical History:  Diagnosis Date  . Hypertension    History reviewed. No pertinent surgical history.   Family History:  As noted, lives with parents and sister, identifies their medical health/illnesses as a stressor.  Sister and Mother have history of depression  and anxiety, and cousin and a nephew have history of Bipolar Disorder. No suicides in family. He states family member has responded well to Celexa in the past.   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 Denies legal issues. Financial difficulties are another stressor. Plays guitar and has been wanting to take further music education.  History  Alcohol Use  . Yes    Comment: socially     History  Drug Use No    Social History   Social History  . Marital status: Single    Spouse name: N/A  . Number of children: N/A  . Years of education: N/A   Social History Main Topics  . Smoking status: Never Smoker  . Smokeless tobacco: Never Used  . Alcohol use Yes     Comment: socially  . Drug use: No  . Sexual activity: No   Other Topics Concern  . None   Social History Narrative  . None   Additional Social History: He is single. He completed the 12th grade, and currently works as an Therapist, sports( pet sitting, Company secretary, and plays instruments)   Musculoskeletal: Strength & Muscle Tone: within normal limits Gait & Station: normal Patient leans: N/A  Psychiatric Specialty Exam: Physical Exam   Review of Systems  Constitutional: Negative.   HENT: Negative.   Eyes: Negative.   Respiratory: Negative.   Cardiovascular: Positive for palpitations.       When anxious    Gastrointestinal: Negative.   Genitourinary:       Urinary obstruction when having access only to public restrooms, but states thus far this has not been a problem in hospital setting   Musculoskeletal: Negative.   Skin: Negative.   Neurological: Negative.   Endo/Heme/Allergies: Negative.   Psychiatric/Behavioral: Positive for depression and suicidal ideas. The patient is nervous/anxious.   all other systems negative   Blood pressure 128/88, pulse (!) 113, temperature 97.5 F (36.4 C), resp. rate 12, height '5\' 10"'$  (1.778 m), weight 110.9 kg (244 lb 8 oz), SpO2 98 %.Body mass index is 35.08 kg/m.  General Appearance: Fairly Groomed  Engineer, water::  Good  Speech:  Normal Rate  Volume:  Normal  Mood:  Anxious and Depressed  Affect:  reactive,  smiles appropriately, presents anxious   Thought Process:  Goal Directed and Linear  Orientation:  Full (Time, Place, and Person)  Thought Content:  ruminative about family stressors, denies hallucinations, no delusions  Suicidal Thoughts:  Yes.  without intent/plan- denies any current plan or intention of hurting self on unit and contracts for safety on unit   Homicidal Thoughts:  No  Memory:  Recent and Remote grossly intact  Judgement:  Good  Insight:  Good  Psychomotor Activity:  Normal  Concentration:  Good  Recall:  Good  Fund of Knowledge:Good  Language: Good  Akathisia:  Negative  Handed:  Right  AIMS (if indicated):     Assets:  Desire for Improvement Housing Resilience Vocational/Educational  ADL's:  Impaired  Cognition: WNL  Sleep:  Number of Hours: 5.75   Risk to Self: Is patient at risk for suicide?: Yes Risk to Others:  No  Prior Inpatient Therapy:  [facility] 05/16, 12/14 and multiple ED visits Prior Outpatient Therapy:  Dr. Darnell Level at [facility] [place]  Alcohol Screening: 1. How often do you have a drink containing alcohol?: 2 to 4 times a month 2. How many drinks containing alcohol do you have on  a typical day when you are drinking?: 1 or 2 3. How often do you have six or more drinks on one occasion?: Never Preliminary Score: 0 9. Have you or someone else been injured as a result of your drinking?: No 10. Has a relative or friend or a doctor or another health worker been concerned about your drinking or suggested you cut down?: No Alcohol Use Disorder Identification Test Final Score (AUDIT): 2 Brief Intervention: AUDIT score less than 7 or less-screening does not suggest unhealthy drinking-brief intervention not indicated  Allergies:   Allergies  Allergen Reactions  . Pollen Extract    Lab Results:  Results for orders placed or performed during the hospital encounter of 01/01/16 (from the past 48 hour(s))  Comprehensive metabolic panel     Status: Abnormal    Collection Time: 01/01/16  8:35 PM  Result Value Ref Range   Sodium 136 135 - 145 mmol/L   Potassium 3.6 3.5 - 5.1 mmol/L   Chloride 101 101 - 111 mmol/L   CO2 25 22 - 32 mmol/L   Glucose, Bld 89 65 - 99 mg/dL   BUN 20 6 - 20 mg/dL   Creatinine, Ser 1.09 0.61 - 1.24 mg/dL   Calcium 9.4 8.9 - 10.3 mg/dL   Total Protein 8.2 (H) 6.5 - 8.1 g/dL   Albumin 5.0 3.5 - 5.0 g/dL   AST 43 (H) 15 - 41 U/L   ALT 95 (H) 17 - 63 U/L   Alkaline Phosphatase 65 38 - 126 U/L   Total Bilirubin 1.0 0.3 - 1.2 mg/dL   GFR calc non Af Amer >60 >60 mL/min   GFR calc Af Amer >60 >60 mL/min    Comment: (NOTE) The eGFR has been calculated using the CKD EPI equation. This calculation has not been validated in all clinical situations. eGFR's persistently <60 mL/min signify possible Chronic Kidney Disease.    Anion gap 10 5 - 15  Ethanol     Status: None   Collection Time: 01/01/16  8:35 PM  Result Value Ref Range   Alcohol, Ethyl (B) <5 <5 mg/dL    Comment:        LOWEST DETECTABLE LIMIT FOR SERUM ALCOHOL IS 5 mg/dL FOR MEDICAL PURPOSES ONLY   Salicylate level     Status: None   Collection Time: 01/01/16  8:35 PM  Result Value Ref Range   Salicylate Lvl <3.5 2.8 - 30.0 mg/dL  Acetaminophen level     Status: Abnormal   Collection Time: 01/01/16  8:35 PM  Result Value Ref Range   Acetaminophen (Tylenol), Serum <10 (L) 10 - 30 ug/mL    Comment:        THERAPEUTIC CONCENTRATIONS VARY SIGNIFICANTLY. A RANGE OF 10-30 ug/mL MAY BE AN EFFECTIVE CONCENTRATION FOR MANY PATIENTS. HOWEVER, SOME ARE BEST TREATED AT CONCENTRATIONS OUTSIDE THIS RANGE. ACETAMINOPHEN CONCENTRATIONS >150 ug/mL AT 4 HOURS AFTER INGESTION AND >50 ug/mL AT 12 HOURS AFTER INGESTION ARE OFTEN ASSOCIATED WITH TOXIC REACTIONS.   cbc     Status: None   Collection Time: 01/01/16  8:35 PM  Result Value Ref Range   WBC 10.5 4.0 - 10.5 K/uL   RBC 5.12 4.22 - 5.81 MIL/uL   Hemoglobin 15.7 13.0 - 17.0 g/dL   HCT 46.8 39.0 - 52.0 %    MCV 91.4 78.0 - 100.0 fL   MCH 30.7 26.0 - 34.0 pg   MCHC 33.5 30.0 - 36.0 g/dL   RDW 13.0 11.5 - 15.5 %  Platelets 351 150 - 400 K/uL  Rapid urine drug screen (hospital performed)     Status: None   Collection Time: 01/01/16  9:13 PM  Result Value Ref Range   Opiates NONE DETECTED NONE DETECTED   Cocaine NONE DETECTED NONE DETECTED   Benzodiazepines NONE DETECTED NONE DETECTED   Amphetamines NONE DETECTED NONE DETECTED   Tetrahydrocannabinol NONE DETECTED NONE DETECTED   Barbiturates NONE DETECTED NONE DETECTED    Comment:        DRUG SCREEN FOR MEDICAL PURPOSES ONLY.  IF CONFIRMATION IS NEEDED FOR ANY PURPOSE, NOTIFY LAB WITHIN 5 DAYS.        LOWEST DETECTABLE LIMITS FOR URINE DRUG SCREEN Drug Class       Cutoff (ng/mL) Amphetamine      1000 Barbiturate      200 Benzodiazepine   601 Tricyclics       093 Opiates          300 Cocaine          300 THC              50    Current Medications: Current Facility-Administered Medications  Medication Dose Route Frequency Provider Last Rate Last Dose  . acetaminophen (TYLENOL) tablet 650 mg  650 mg Oral Q4H PRN Lurena Nida, NP      . alum & mag hydroxide-simeth (MAALOX/MYLANTA) 200-200-20 MG/5ML suspension 30 mL  30 mL Oral PRN Lurena Nida, NP      . diphenhydrAMINE (BENADRYL) capsule 50 mg  50 mg Oral QHS,MR X 1 Dara Hoyer, PA-C   50 mg at 01/03/16 0114  . ibuprofen (ADVIL,MOTRIN) tablet 600 mg  600 mg Oral Q8H PRN Lurena Nida, NP   600 mg at 01/03/16 2355  . nicotine (NICODERM CQ - dosed in mg/24 hours) patch 21 mg  21 mg Transdermal Daily Lurena Nida, NP      . ondansetron [facility]) tablet 4 mg  4 mg Oral Q8H PRN Lurena Nida, NP       PTA Medications: Prescriptions Prior to Admission  Medication Sig Dispense Refill Last Dose  . acetaminophen (TYLENOL) 500 MG tablet Take 1,000 mg by mouth every 6 (six) hours as needed for moderate pain or headache.   Past Week at Unknown time  . albuterol (PROVENTIL  HFA;VENTOLIN HFA) 108 (90 Base) MCG/ACT inhaler Inhale into the lungs.   12/31/2015 at Unknown time  . citalopram (CELEXA) 20 MG tablet Take 1 tablet (20 mg total) by mouth daily. For depression/anxiety (Patient not taking: Reported on 01/01/2016) 30 tablet 0 Not Taking at Unknown time  . citalopram (CELEXA) 40 MG tablet Take 40 mg by mouth daily.    12/31/2015 at Unknown time  . fluticasone (FLONASE) 50 MCG/ACT nasal spray 1 spray by Each Nare route daily.   01/01/2016 at Unknown time  . gabapentin (NEURONTIN) 300 MG capsule Take 1 capsule (300 mg total) by mouth 2 (two) times daily. For mood control (Patient not taking: Reported on 01/01/2016) 60 capsule 0 Not Taking at Unknown time  . hydrochlorothiazide (HYDRODIURIL) 12.5 MG tablet Take 12.5 mg by mouth daily.    01/01/2016 at Unknown time  . hydrochlorothiazide (MICROZIDE) 12.5 MG capsule Take 1 capsule (12.5 mg total) by mouth daily. (Patient not taking: Reported on 01/01/2016) 30 capsule 0 Completed Course at Unknown time  . ibuprofen (ADVIL,MOTRIN) 200 MG tablet Take 400 mg by mouth every 6 (six) hours as needed for fever or moderate pain.  01/01/2016 at Unknown time  . Ibuprofen-Diphenhydramine HCl (ADVIL PM) 200-25 MG CAPS Take 2 capsules by mouth at bedtime as needed (sleep).   12/31/2015 at Unknown time  . loratadine (CLARITIN) 10 MG tablet Take 1 tablet (10 mg total) by mouth daily. For allergies. (Patient not taking: Reported on 01/01/2016)   Completed Course at Unknown time  . loratadine (CLARITIN) 10 MG tablet Take 10 mg by mouth daily.    12/31/2015 at Unknown time  . LORazepam (ATIVAN) 0.5 MG tablet Take 1 tablet (0.5 mg total) by mouth 3 (three) times daily. (Patient not taking: Reported on 10/14/2014) 90 tablet 0 Not Taking at Unknown time  . metoprolol (LOPRESSOR) 50 MG tablet Take 1 tablet (50 mg total) by mouth 2 (two) times daily. (Patient not taking: Reported on 01/01/2016) 60 tablet 0 Completed Course at Unknown time  . mirtazapine  (REMERON) 15 MG tablet Take 1 tablet (15 mg total) by mouth at bedtime. (Patient not taking: Reported on 01/01/2016) 30 tablet 0 Completed Course at Unknown time  . Pseudoeph-Doxylamine-DM-APAP (NYQUIL PO) Take 30 mLs by mouth at bedtime as needed (cold symptoms).   Past Week at Unknown time  . traZODone (DESYREL) 100 MG tablet Take 100 mg by mouth at bedtime.    12/31/2015 at Unknown time  . traZODone (DESYREL) 50 MG tablet Take 1 tablet (50 mg total) by mouth at bedtime as needed for sleep (May repeat X1). (Patient not taking: Reported on 01/01/2016) 30 tablet 0 Not Taking at Unknown time    Previous Psychotropic Medications:  Yes- prozac   Substance Abuse History in the last 12 months:  No. denies drug or alcohol abuse   Consequences of Substance Abuse: Denies   Results for orders placed or performed during the hospital encounter of 01/01/16 (from the past 72 hour(s))  Comprehensive metabolic panel     Status: Abnormal   Collection Time: 01/01/16  8:35 PM  Result Value Ref Range   Sodium 136 135 - 145 mmol/L   Potassium 3.6 3.5 - 5.1 mmol/L   Chloride 101 101 - 111 mmol/L   CO2 25 22 - 32 mmol/L   Glucose, Bld 89 65 - 99 mg/dL   BUN 20 6 - 20 mg/dL   Creatinine, Ser 1.09 0.61 - 1.24 mg/dL   Calcium 9.4 8.9 - 10.3 mg/dL   Total Protein 8.2 (H) 6.5 - 8.1 g/dL   Albumin 5.0 3.5 - 5.0 g/dL   AST 43 (H) 15 - 41 U/L   ALT 95 (H) 17 - 63 U/L   Alkaline Phosphatase 65 38 - 126 U/L   Total Bilirubin 1.0 0.3 - 1.2 mg/dL   GFR calc non Af Amer >60 >60 mL/min   GFR calc Af Amer >60 >60 mL/min    Comment: (NOTE) The eGFR has been calculated using the CKD EPI equation. This calculation has not been validated in all clinical situations. eGFR's persistently <60 mL/min signify possible Chronic Kidney Disease.    Anion gap 10 5 - 15  Ethanol     Status: None   Collection Time: 01/01/16  8:35 PM  Result Value Ref Range   Alcohol, Ethyl (B) <5 <5 mg/dL    Comment:        LOWEST DETECTABLE  LIMIT FOR SERUM ALCOHOL IS 5 mg/dL FOR MEDICAL PURPOSES ONLY   Salicylate level     Status: None   Collection Time: 01/01/16  8:35 PM  Result Value Ref Range   Salicylate Lvl <1.6 2.8 -  30.0 mg/dL  Acetaminophen level     Status: Abnormal   Collection Time: 01/01/16  8:35 PM  Result Value Ref Range   Acetaminophen (Tylenol), Serum <10 (L) 10 - 30 ug/mL    Comment:        THERAPEUTIC CONCENTRATIONS VARY SIGNIFICANTLY. A RANGE OF 10-30 ug/mL MAY BE AN EFFECTIVE CONCENTRATION FOR MANY PATIENTS. HOWEVER, SOME ARE BEST TREATED AT CONCENTRATIONS OUTSIDE THIS RANGE. ACETAMINOPHEN CONCENTRATIONS >150 ug/mL AT 4 HOURS AFTER INGESTION AND >50 ug/mL AT 12 HOURS AFTER INGESTION ARE OFTEN ASSOCIATED WITH TOXIC REACTIONS.   cbc     Status: None   Collection Time: 01/01/16  8:35 PM  Result Value Ref Range   WBC 10.5 4.0 - 10.5 K/uL   RBC 5.12 4.22 - 5.81 MIL/uL   Hemoglobin 15.7 13.0 - 17.0 g/dL   HCT 46.8 39.0 - 52.0 %   MCV 91.4 78.0 - 100.0 fL   MCH 30.7 26.0 - 34.0 pg   MCHC 33.5 30.0 - 36.0 g/dL   RDW 13.0 11.5 - 15.5 %   Platelets 351 150 - 400 K/uL  Rapid urine drug screen (hospital performed)     Status: None   Collection Time: 01/01/16  9:13 PM  Result Value Ref Range   Opiates NONE DETECTED NONE DETECTED   Cocaine NONE DETECTED NONE DETECTED   Benzodiazepines NONE DETECTED NONE DETECTED   Amphetamines NONE DETECTED NONE DETECTED   Tetrahydrocannabinol NONE DETECTED NONE DETECTED   Barbiturates NONE DETECTED NONE DETECTED    Comment:        DRUG SCREEN FOR MEDICAL PURPOSES ONLY.  IF CONFIRMATION IS NEEDED FOR ANY PURPOSE, NOTIFY LAB WITHIN 5 DAYS.        LOWEST DETECTABLE LIMITS FOR URINE DRUG SCREEN Drug Class       Cutoff (ng/mL) Amphetamine      1000 Barbiturate      200 Benzodiazepine   712 Tricyclics       458 Opiates          300 Cocaine          300 THC              50    Treatment Plan Summary: Daily contact with patient to assess and evaluate  symptoms and progress in treatment, Medication management, Plan inpatient treatment and medications as above  1 Admit for crisis management and stabilization.  2. Medication management to reduce symptoms to baseline and improved the patient's overall level of functioning. Closely monitor the side effects, efficacy and therapeutic response of medication. Will obtain TSH, Prolactin, A1c, and lipid prior to initiating anti-psychotic medication.  3. Treat health problem as indicated.  4. Developed treatment plan to decrease the risk of relapse upon discharge and to reduce the need for readmission.  5. Psychosocial education regarding relapse prevention in self-care.  6. Healthcare followup as needed for medical problems and called consults as indicated.  7. Increase collateral information.  8. Restart home medication where appropriate  9. Encouraged to participate and verbalize into group milieu therapy.    Observation Level/Precautions:  15 minute checks  Laboratory:  Will obtain TSH- states hypothyroidism   Psychotherapy:  Supportive, milieu  Medications: Will start Abilify '5mg'$  po qhs. Will continue Celexa '40mg'$  po daily. Will resume home medications.   Consultations:  If needed   Discharge Concerns:  Patient states he is reluctant to return home after discharge as he stats it is difficult for him to deal with the stress  there .  Estimated LOS: 5- 6 days   Other:     I certify that inpatient services furnished can reasonably be expected to improve the patient's condition.    Gayland Curry Starkes 8/19/20178:52 AM

## 2016-01-03 NOTE — Progress Notes (Signed)
Patient denied SI/HI/AVH at beginning of shift. At 12:15pm, patient reported to staff that he was having a, "Panic attack", and that he was having active thoughts of, "strangling myself with bedding, or taking the faucet off in my room and slamming my head into the plumbing". NP notified and he was assessed by NP. Order received for Ativan IM and Seroquel PO . Both medications were given. Patient was able to contract for safety after speaking with NP. Patient continues to deny SI throughout rest of shift. 15 minute checks done for safety. On his self assessment patient reports 3/10 for depression, 1/10 for hopelessness and 4/10 for anxiety. His goal is: "Talk to staff about D/C plan".

## 2016-01-04 DIAGNOSIS — F3160 Bipolar disorder, current episode mixed, unspecified: Secondary | ICD-10-CM

## 2016-01-04 LAB — LIPID PANEL
Cholesterol: 219 mg/dL — ABNORMAL HIGH (ref 0–200)
HDL: 42 mg/dL (ref >40)
LDL Cholesterol: 134 mg/dL — ABNORMAL HIGH (ref 0–99)
TRIGLYCERIDES: 217 mg/dL — AB (ref <150)
Total CHOL/HDL Ratio: 5.2 RATIO
VLDL: 43 mg/dL — ABNORMAL HIGH (ref 0–40)

## 2016-01-04 LAB — TSH: TSH: 1.692 u[IU]/mL (ref 0.350–4.500)

## 2016-01-04 MED ORDER — ARIPIPRAZOLE 15 MG PO TABS
7.5000 mg | ORAL_TABLET | Freq: Every day | ORAL | Status: DC
  Filled 2016-01-04: qty 1

## 2016-01-04 MED ORDER — ARIPIPRAZOLE 5 MG PO TABS
5.0000 mg | ORAL_TABLET | Freq: Every day | ORAL | Status: AC
  Administered 2016-01-04: 5 mg via ORAL
  Filled 2016-01-04: qty 1

## 2016-01-04 NOTE — Progress Notes (Signed)
Patient ID: Brent Rios, male   DOB: Feb 17, 1986, 30 y.o.   MRN: [id number] [facility] MD Progress Note  01/04/2016 10:21 AM Brent Rios  MRN:  [id number]   Subjective:  Patient reports that he continues to feel quite anxious, although he did feel better yesterday evening and he feels Seroquel may be helpful. " I slept for 4 hours after that shot yesterday . I handled it fine and Im doing pretty good. I had actually spoken to [place] in the ED and will be going to [facility] for a treatment program that costs $100. I just need to know when I am going home. "He is not endorsing medication side effects.  Objective:    I have discussed case with treatment team and have met with patient. Patient has a long history of Anxiety in addition to his mood disorder, and worries significantly. He has been ambivalent about whether to return home after discharge,or go to a long term program in [place]. He conducts most of his work in [place] so it would be better for him. He reports improvement in depression, he states he feels better, and is denying any severe sadness or anhedonia. Celexa was resumed and his Abilify was started. He reports tolerating the Abilify 74m well yesterday, reports some GI symptoms of diarrhea. However he reports not taking his acidophilous since being here and he takes it daily.  Tolerating medications well and denies side effects. No disruptive behaviors on unit. Has been participating in milieu. His goal today is to smile and work on coping skills , pay attention, get out of here and stay stable."  Responds fairly to support, encouragement, empathy.  Principal Problem: Bipolar 1 disorder, mixed ([place] Diagnosis:   Patient Active Problem List   Diagnosis Date Noted  . Bipolar 1 disorder, mixed, moderate ([place] [F31.62] 01/02/2016  . Bipolar 1 disorder, mixed ([facility] [F31.60] 01/02/2016  . Suicidal ideation [R45.851]   . Generalized anxiety disorder [F41.1]    Total Time spent  with patient: 25 minutes   Past Medical History:  Past Medical History:  Diagnosis Date  . Hypertension    History reviewed. No pertinent surgical history. Family History:  Family History  Problem Relation Age of Onset  . Bipolar disorder Cousin    Social History:  History  Alcohol Use  . Yes    Comment: socially     History  Drug Use No    Social History   Social History  . Marital status: Single    Spouse name: N/A  . Number of children: N/A  . Years of education: N/A   Social History Main Topics  . Smoking status: Never Smoker  . Smokeless tobacco: Never Used  . Alcohol use Yes     Comment: socially  . Drug use: No  . Sexual activity: No   Other Topics Concern  . None   Social History Narrative  . None   Additional History:    Sleep:  Variable   Appetite:  Good   Musculoskeletal: Strength & Muscle Tone: within normal limits Gait & Station: normal Patient leans: N/A  Psychiatric Specialty Exam: Physical Exam  Nursing note and vitals reviewed.   Review of Systems  Gastrointestinal: Positive for diarrhea. Negative for abdominal pain, blood in stool, constipation, heartburn, melena, nausea and vomiting.  Musculoskeletal: Negative for back pain, joint pain, myalgias and neck pain.  Neurological: Negative.  Negative for dizziness, tingling, tremors, sensory change and speech change.  Psychiatric/Behavioral: Positive  for depression. Negative for hallucinations, memory loss, substance abuse and suicidal ideas. The patient is nervous/anxious. The patient does not have insomnia.   All other systems reviewed and are negative.   Blood pressure (!) 140/109, pulse 92, temperature 98.5 F (36.9 C), resp. rate 16, height 5' 10" (1.778 m), weight 110.9 kg (244 lb 8 oz), SpO2 98 %.Body mass index is 35.08 kg/m.  General Appearance: improved grooming  Eye Contact::  Good  Speech:  Normal   Volume:  Normal  Mood:   Mood improved- minimizes ongoing depression at  present  Affect:   Reactive, but remains Anxious   Thought Process:  Linear   Orientation:  Full (Time, Place, and Person)  Thought Content:  Ruminative about family stressors, no hallucinations, no delusions   Suicidal Thoughts:  No- at this time denies any thoughts of hurting self or anyone else   Homicidal Thoughts:  No  Memory:  Immediate;   Good Recent;   Good Remote;   Good  Judgement:  NA  Insight:  Good  Psychomotor Activity: normal  Concentration:  Good  Recall:  Good  Fund of Knowledge:Good  Language: Good  Akathisia:  Negative  Handed:  Right  AIMS (if indicated):     Assets:  Resilience  ADL's:  Intact  Cognition: WNL  Sleep:  "really well"    Current Medications: Current Facility-Administered Medications  Medication Dose Route Frequency Provider Last Rate Last Dose  . acetaminophen (TYLENOL) tablet 650 mg  650 mg Oral Q4H PRN Lurena Nida, NP   650 mg at 01/04/16 0835  . alum & mag hydroxide-simeth (MAALOX/MYLANTA) 200-200-20 MG/5ML suspension 30 mL  30 mL Oral PRN Lurena Nida, NP   30 mL at 01/04/16 0425  . ARIPiprazole (ABILIFY) tablet 5 mg  5 mg Oral Daily Nanci Pina, FNP   5 mg at 01/03/16 2130  . diphenhydrAMINE (BENADRYL) capsule 50 mg  50 mg Oral QHS,MR X 1 Dara Hoyer, PA-C   50 mg at 01/03/16 2308  . ibuprofen (ADVIL,MOTRIN) tablet 600 mg  600 mg Oral Q8H PRN Lurena Nida, NP   600 mg at 01/04/16 0959  . ondansetron (ZOFRAN) tablet 4 mg  4 mg Oral Q8H PRN Lurena Nida, NP        Lab Results:  Results for orders placed or performed during the hospital encounter of 01/02/16 (from the past 48 hour(s))  TSH     Status: None   Collection Time: 01/04/16  6:14 AM  Result Value Ref Range   TSH 1.692 0.350 - 4.500 uIU/mL    Comment: Performed at [facility]    Physical Findings: AIMS: Facial and Oral Movements Muscles of Facial Expression: None, normal Lips and Perioral Area: None, normal Jaw: None, normal Tongue: None,  normal,Extremity Movements Upper (arms, wrists, hands, fingers): None, normal Lower (legs, knees, ankles, toes): None, normal, Trunk Movements Neck, shoulders, hips: None, normal, Overall Severity Severity of abnormal movements (highest score from questions above): None, normal Incapacitation due to abnormal movements: None, normal Patient's awareness of abnormal movements (rate only patient's report): No Awareness, Dental Status Current problems with teeth and/or dentures?: No Does patient usually wear dentures?: No  CIWA:    COWS:      Assessment/ Plan-  Depression is improving, but anxiety has become more prominent symptom, particularly as he approaches discharge. He is ambivalent about whether to return home- feels he should but at the same time worries that home /  family related tension will cause increased anxiety. At this time states he is leaning towards going to live in [place] at a facility that cost him $100. Denies medication side effects.   Treatment Plan Summary: Continue inpatient treatment. IContinue Abilify 67m po daily to target depressive symptoms, chronic suicidality, and agitation. Will increase Abilify 7.524mon 01/05/2016.  Continue Celexa 40 mgrs QDAY for anxiety, depression.   10:33 AM TaPriscille LovelessFNP-BC 01/04/2016

## 2016-01-04 NOTE — Progress Notes (Signed)
D: Patient reported having headache earlier but feels ok now. Denies pain, SI, AH/VH at this time. No behavioral issues noted.  A: Staff offered support and encouraged patient to continue with the treatment plan. Due meds given as ordered. Every 15 minutes check for safety maintained. Will continue to monitor patient for safety and stability.  R: Patient remains safe. Sleeping at this time.

## 2016-01-04 NOTE — Progress Notes (Signed)
D: Patient more assertive and composed tonight. Seen on dayroom watching TV and interacting with peers. Reported his day was good with the exception of "not been able to sleep". Patient quickly chipped in by saying "well, I try not to sleep during the day so I can sleep at night". The writer asked if he feels sleepy at any time during the day. Patient stated "I tried not to sleep". Denies pain, SI, AH/VH at this time. No behavioral issues noted.  A: Staff offered support and encouragement as needed. Due meds given as ordered. Every 15 minutes check for safety maintained. Will continue to monitor patient for safety and stability.  R: Patient remains safe. sleppin at this time.

## 2016-01-04 NOTE — Plan of Care (Signed)
Problem: Safety: Goal: Periods of time without injury will increase Outcome: Progressing Patient has not engaged in self harm, denies SI.  Problem: Medication: Goal: Compliance with prescribed medication regimen will improve Outcome: Progressing Patient is med compliant.   

## 2016-01-04 NOTE — [facility] Group Notes (Signed)
[facility] Group Notes:  (Clinical Social Work)   @TODAY @   1:15-2:15PM  Summary of Progress/Problems:   The main focus of today's process group was to                        1)  Discuss the importance of adding supports                       2)  Identify various supports that could be added for various needs                       3)  Talk about barriers to using supports  An emphasis was placed on using counselor, doctor, therapy groups, 12-step groups, and problem-specific support groups to expand supports.  Much detail was given by CSW, but mostly by other patients, on each of these types of professional supports.  The patient expressed full comprehension of the concepts presented, and agreed that there is a need to add more supports.  The patient remained monopolizing, always raises his hand and asks to comment.  He stated at one point when diagnosis process was being discuss that he has never really revealed "all of myself" to anyone and therefore keeps getting different diagnoses each time.  He initially stated this will take up the rest of his life, then he amended this to say he is hopeful his diagnosis will actually be determined so that he can have the proper medicine.  Type of Therapy:  Process Group with Motivational Interviewing  Participation Level:  Active  Participation Quality:  Monopolizing and Sharing  Affect:  Not Congruent  Cognitive:  Appropriate  Insight:  Developing/Improving  Engagement in Therapy:  Engaged  Modes of Intervention:   Education, Teacher, English as a foreign languageupport and Processing, Activity  Tommey Barret Grossman-Orr, LCSW @TODAY @   4:36 PM

## 2016-01-04 NOTE — Progress Notes (Signed)
[facility] Group Notes:  (Nursing/MHT/Case Management/Adjunct)  Date:  01/04/2016  Time:  10:38 PM  Type of Therapy:  Psychoeducational Skills  Participation Level:  Active  Participation Quality:  Attentive  Affect:  Appropriate  Cognitive:  Lacking  Insight:  Improving  Engagement in Group:  Limited  Modes of Intervention:  Education  Summary of Progress/Problems: The patient expressed in group that his peers cheered him up throughout the day. In terms of the theme for the day, his support system will be made up of his outpatient psychiatrist.   Westly PamGOODMAN, Ruby Logiudice S 01/04/2016, 10:38 PM

## 2016-01-04 NOTE — Progress Notes (Signed)
D: Patient up and visible in the milieu. Spoke with patient 1:1. Rates sleep as poor, appetite poor, energy low and concentration poor. Patient's affect animated, mood anxious. Rating his depression at a 3/10, hopelessness at a 2/10 and anxiety at a 4/10. States goal for today is to "to smile, listen and relax." Complained of a headache this AM of a 4/10 which progressed to an 8/10. States he has a hx of headaches and is currently seeing a neurologist.   A: Medicated per orders, tylenol and motrin given prn. Emotional support offered and self inventory reviewed.   R: On reassess, patient's pain is a 4/10. Patient denies SI/HI and remains safe on level III obs.

## 2016-01-04 NOTE — Progress Notes (Signed)
[facility] Group Notes:  (Nursing/MHT/Case Management/Adjunct)  Date:  01/04/2016  Time:  1:09 AM  Type of Therapy:  Psychoeducational Skills  Participation Level:  Active  Participation Quality:  Appropriate  Affect:  Appropriate  Cognitive:  Appropriate  Insight:  Appropriate  Engagement in Group:  Developing/Improving  Modes of Intervention:  Education  Summary of Progress/Problems: The patient verbalized in group last evening that he had a bad moment during the daytime but otherwise feeling better. He did not expound any further regarding the incident. In terms of the theme for the day, his coping skill will be to exercise more often.   Brent Rios S 01/04/2016, 1:09 AM

## 2016-01-04 NOTE — [facility] Group Notes (Signed)
[facility] Group Notes:  (Nursing/MHT/Case Management/Adjunct)  Date:  01/04/2016  Time:  1030  Type of Therapy:  Nurse Education - Healthy Support Systems  Participation Level:  Active  Participation Quality:  Attentive  Affect:  Appropriate  Cognitive:  Alert  Insight:  Improving  Engagement in Group:  Engaged  Modes of Intervention:  Discussion, Education and Support  Summary of Progress/Problems: Patient attended group, participated and contributed to the discussion.   Merian CapronFriedman, [facility] 01/04/2016, 1100

## 2016-01-05 LAB — PROLACTIN: Prolactin: 7.7 ng/mL (ref 4.0–15.2)

## 2016-01-05 MED ORDER — LITHIUM CARBONATE 300 MG PO CAPS
300.0000 mg | ORAL_CAPSULE | Freq: Two times a day (BID) | ORAL | Status: DC
  Administered 2016-01-05 – 2016-01-08 (×6): 300 mg via ORAL
  Filled 2016-01-05 (×8): qty 1

## 2016-01-05 MED ORDER — ARIPIPRAZOLE 15 MG PO TABS
15.0000 mg | ORAL_TABLET | Freq: Every day | ORAL | Status: DC
  Administered 2016-01-05 – 2016-01-07 (×3): 15 mg via ORAL
  Filled 2016-01-05: qty 1
  Filled 2016-01-05: qty 7
  Filled 2016-01-05 (×3): qty 1

## 2016-01-05 MED ORDER — HYDROXYZINE HCL 50 MG PO TABS
50.0000 mg | ORAL_TABLET | Freq: Once | ORAL | Status: AC
  Administered 2016-01-05: 50 mg via ORAL
  Filled 2016-01-05 (×2): qty 1

## 2016-01-05 NOTE — Progress Notes (Signed)
Recreation Therapy Notes  Date: 01/05/16 Time: 0930 Location: 300 Hall Group Room  Group Topic: Stress Management  Goal Area(s) Addresses:  Patient will verbalize importance of using healthy stress management.  Patient will identify positive emotions associated with healthy stress management.   Intervention: Stress Management  Activity :  Progressive Muscle Relaxation.  LRT introduced the stress management technique of progressive muscle relaxation to the patients.  Patients were to follow along as LRT read script to engaged in the technique.  Education:  Stress Management, Discharge Planning.   Education Outcome: Acknowledges edcuation/In group clarification offered/Needs additional education  Clinical Observations/Feedback: Pt did not attend group.   Crystel Demarco, LRT/CTRS    Twilia Yaklin A 01/05/2016 1:09 PM 

## 2016-01-05 NOTE — Tx Team (Signed)
Interdisciplinary Treatment Plan Update (Adult) Date: 01/05/2016   Date: 01/05/2016 4:23 PM  Progress in Treatment:  Attending groups: Yes  Participating in groups: Yes  Taking medication as prescribed: Yes  Tolerating medication: Yes  Family/Significant othe contact made: No, CSW attempting to make contact with brother Patient understands diagnosis: Continuing to assess Discussing patient identified problems/goals with staff: Yes  Medical problems stabilized or resolved: Yes  Denies suicidal/homicidal ideation: No, endorses passive SI Patient has not harmed self or Others: Yes   New problem(s) identified: None identified at this time.   Discharge Plan or Barriers: CSW will assess for appropriate discharge plan and relevant barriers.   Additional comments:  Patient and CSW reviewed pt's identified goals and treatment plan. Patient verbalized understanding and agreed to treatment plan.   Reason for Continuation of Hospitalization:  Depression Medication stabilization Suicidal ideation Mania  Estimated length of stay: 3-5 days  Review of initial/current patient goals per problem list:   1.  Goal(s): Patient will participate in aftercare plan  Met:  No  Target date: 3-5 days from date of admission   As evidenced by: Patient will participate within aftercare plan AEB aftercare provider and housing plan at discharge being identified.  01/05/16: CSW to work with Pt to assess for appropriate discharge plan and faciliate appointments and referrals as needed prior to d/c.  2.  Goal (s): Patient will exhibit decreased depressive symptoms and suicidal ideations.  Met:  No  Target date: 3-5 days from date of admission   As evidenced by: Patient will utilize self rating of depression at 3 or below and demonstrate decreased signs of depression or be deemed stable for discharge by MD. 01/05/16: Pt was admitted with symptoms of depression, rating 10/10. Pt continues to present with flat  affect and depressive symptoms.  Pt will demonstrate decreased symptoms of depression and rate depression at 3/10 or lower prior to discharge.  6. Goal (s):  Patient will demonstrate decreased signs of mania . Met:  No . Target date: 3-5 days from date of admission  . As evidenced by:  Patient demonstrate decreased signs of mania AEB decreased mood instability and demonstration of stable mood    -01/05/16: Pt presents with pressured speech, labile mood, and grandiosity.  Attendees:  Patient:    Family:    Physician: Dr. Parke Poisson, MD  01/05/2016 4:23 PM  Nursing: Leanne Lovely, Darrol Angel, RN 01/05/2016 4:23 PM  Clinical Social Worker Peri Maris, [place] 01/05/2016 4:23 PM  Other: Tilden Fossa, LCSWA 01/05/2016 4:23 PM  Clinical: Lars Pinks, RN Case manager  01/05/2016 4:23 PM  Other:  01/05/2016 4:23 PM  Other:     Peri Maris, [place] Social Work [phone]

## 2016-01-05 NOTE — Progress Notes (Addendum)
Around 3 am, patient woke up and stated "I can't sleep anymore. I tried but I'm sorry. I didn't mean to but I can't sleep. Patient continues to walk and pace around. Later, he stated "I woke up because I'm having stomach upset. Requested tylenol for headache. Staff administered Tylenol prn as ordered. After 10 mins, patient came requesting for another Tylenol stated that he threw up. Staff offered Zofran -patient accepted.  10 mins later, the MHT reported to staff that patient is seen crying. Staff asked patient what was going on, patient stated "right now, I feel suicidal. I don't feel safe". Patient requested to use quiet room or restriction room stated "I might feel safe in there". Meanwhile, he verbally contracted for safety when asked. Patient was taken to seclusion room at 4:45am. Drenda FreezeFran NP was made aware of the situation. She ordered one time of vistaril 50 mg. Patient accepted the order. While in the seclusion room, patient toss and move around in the bed.  At 5:30 am, patient requested to go back to his room. He stated "I feel very much better" he denied active SI stated "the thought is gone". Staff continued to observe and monitor patient for safety and stability.  6:15 am, Patient took a shower, dressed up in a pair of jeans and shirt. Feeling happy and energetic. In day room laughing and interacting with peers. Will continue to monitor patient.

## 2016-01-05 NOTE — Progress Notes (Signed)
Data: Patient reported to staff that he, "can't breath". When RN went to assess him he was on the floor, face down and making heaving breathing sounds. Staff report that he was observed gently putting himself on the floor. He was not cyanotic and his O2 sat was 100%  He was breathing without difficulty, however he was forcing his air forcefully out through his moth and forcing it in through his mouth, also. He was not abdominal breathing. VS were WNL, except BP which was slightly elevated. Patient encouraged to breath in through nose and out through mouth. Patient then reported that the back of his neck, "is really painfull". AC and NP notified and patient was assessed by both. Patient was able to sit back on his bed and take some tylenol PO. He was noted opening his mouth and eyes wide, when asked about pain, and bobbing his head in response. Patient reports passive SI feeling, with no concrete plan. He is able to verbally contract for safety on the unit and come to staff before he acts on any SI feelings. Patient denies HI/AVH.   Patient interacting well with staff and other patients.  Action. Emotional support and encouragement offered. Education provided on medication, indications and side effect. Q 15 minute checks done for safety. Response. Safety on the unit maintained through 15 minute checks.  Medications taken as prescribed. Attended groups.

## 2016-01-05 NOTE — [facility] Group Notes (Signed)
[facility] LCSW Group Therapy  01/05/2016 1:15pm  Type of Therapy:  Group Therapy vercoming Obstacles  Participation Level:  Active  Participation Quality:  Distracting  Affect:  Incongruent  Cognitive:  Appropriate and Oriented  Insight:  Limited  Engagement in Therapy:  Improving  Modes of Intervention:  Discussion, Exploration, Problem-solving and Support  Description of Group:   In this group patients will be encouraged to explore what they see as obstacles to their own wellness and recovery. They will be guided to discuss their thoughts, feelings, and behaviors related to these obstacles. The group will process together ways to cope with barriers, with attention given to specific choices patients can make. Each patient will be challenged to identify changes they are motivated to make in order to overcome their obstacles. This group will be process-oriented, with patients participating in exploration of their own experiences as well as giving and receiving support and challenge from other group members.  Summary of Patient Progress: Pt presented in group with incongruent affect, pressured speech, and some grandiosity. 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   Therapeutic Modalities:   Cognitive Behavioral Therapy Solution Focused Therapy Motivational Interviewing Relapse Prevention Therapy   Chad CordialLauren Carter, LCSWA 01/05/2016 4:11 PM

## 2016-01-05 NOTE — Plan of Care (Signed)
Problem: Coping: Goal: Ability to cope will improve Outcome: Not Progressing Patient had an episode of extreme anxiety this AM, where he put himself on the floor, breathing heavy and stating that he was in danger if dying.

## 2016-01-05 NOTE — Progress Notes (Addendum)
Patient ID: Brent Rios, male   DOB: [birth year], 30 y.o.   MRN: [id number] [facility] MD Progress Note  01/05/2016 3:39 PM Brent Rios  MRN:  [id number]   Subjective:  Patient reports ongoing symptoms of mania- he states he slept poorly last night, and got up " like at 2 AM", because he just did not feel sleepy. He states " even though I slept only an hour or two, I feel super good , like I slept 9 hours ". He reports feeling very energetic as well . At this time denies medication side effects.   Objective:    I have discussed case with treatment team and have met with patient.  Patient continues to present with poor sleep, fast, at times pressured speech, and a subjective feeling of increased energy. He presents jovial, often laughing during our session , not particularly irritable. He does state " this is not like me, I feel high" . Denies medication side effects .( On Abilify  ) Yesterday had reported some diarrhea, and had an episode where he felt he had difficulty breathing , which he now thinks was a panic attack . At this time denies any ongoing diarrhea, denies nausea or vomiting, and is breathing without any difficulty at room air . Going to groups, visible in day room, interacting with peers, as per staff tends to be monopolizing in groups, but is responsive to redirection.  Principal Problem: Bipolar 1 disorder, mixed ([place]) Diagnosis:   Patient Active Problem List   Diagnosis Date Noted  . Bipolar 1 disorder, mixed, moderate ([place]) [F31.62] 01/02/2016  . Bipolar 1 disorder, mixed ([place]) [F31.60] 01/02/2016  . Suicidal ideation [R45.851]   . Generalized anxiety disorder [F41.1]    Total Time spent with patient: 25 minutes   Past Medical History:  Past Medical History:  Diagnosis Date  . Hypertension    History reviewed. No pertinent surgical history. Family History:  Family History  Problem Relation Age of Onset  . Bipolar disorder Cousin    Social History:  History  Alcohol Use   . Yes    Comment: socially     History  Drug Use No    Social History   Social History  . Marital status: Single    Spouse name: N/A  . Number of children: N/A  . Years of education: N/A   Social History Main Topics  . Smoking status: Never Smoker  . Smokeless tobacco: Never Used  . Alcohol use Yes     Comment: socially  . Drug use: No  . Sexual activity: No   Other Topics Concern  . None   Social History Narrative  . None   Additional History:    Sleep:  Poor   Appetite:  Good   Musculoskeletal: Strength & Muscle Tone: within normal limits Gait & Station: normal Patient leans: N/A  Psychiatric Specialty Exam: Physical Exam  Nursing note and vitals reviewed.   Review of Systems  Gastrointestinal: Positive for diarrhea. Negative for abdominal pain, blood in stool, constipation, heartburn, melena, nausea and vomiting.  Musculoskeletal: Negative for back pain, joint pain, myalgias and neck pain.  Neurological: Negative.  Negative for dizziness, tingling, tremors, sensory change and speech change.  Psychiatric/Behavioral: Positive for depression. Negative for hallucinations, memory loss, substance abuse and suicidal ideas. The patient is nervous/anxious. The patient does not have insomnia.   All other systems reviewed and are negative. States diarrhea has improved, no vomiting , no dyspnea at this time   Blood  pressure (!) 143/93, pulse 96, temperature 98.6 F (37 C), temperature source Oral, resp. rate (!) 24, height '5\' 10"'$  (1.778 m), weight 244 lb 8 oz (110.9 kg), SpO2 94 %.Body mass index is 35.08 kg/m.  General Appearance: improved grooming  Eye Contact::  Good  Speech:  Normal   Volume:  Normal  Mood:   Variable, presents hypomanic   Affect:   Somewhat expansive, laughing often, giggling often   Thought Process:  Linear - no flight of ideations noted   Orientation:  Full (Time, Place, and Person)  Thought Content: no hallucinations, no delusions    Suicidal Thoughts:  No- at this time denies any active suicidal ideations, states he has chronic passive SI, currently contracts for safety on the unit   Homicidal Thoughts:  No- denies homicidal ideations at this time   Memory:  Immediate;   Good Recent;   Good Remote;   Good  Judgement: fair   Insight:   Fair   Psychomotor Activity: normal  Concentration:  Good  Recall:  Good  Fund of Knowledge:Good  Language: Good  Akathisia:  Negative  Handed:  Right  AIMS (if indicated):     Assets:  Resilience  ADL's:  Intact  Cognition: WNL  Sleep:  Poor     Current Medications: Current Facility-Administered Medications  Medication Dose Route Frequency Provider Last Rate Last Dose  . acetaminophen (TYLENOL) tablet 650 mg  650 mg Oral Q4H PRN Lurena Nida, NP   650 mg at 01/05/16 0855  . alum & mag hydroxide-simeth (MAALOX/MYLANTA) 200-200-20 MG/5ML suspension 30 mL  30 mL Oral PRN Lurena Nida, NP   30 mL at 01/04/16 1828  . ARIPiprazole (ABILIFY) tablet 15 mg  15 mg Oral Daily Fernando A Cobos, MD      . diphenhydrAMINE (BENADRYL) capsule 50 mg  50 mg Oral QHS,MR X 1 Dara Hoyer, PA-C   50 mg at 01/04/16 2243  . ibuprofen (ADVIL,MOTRIN) tablet 600 mg  600 mg Oral Q8H PRN Lurena Nida, NP   600 mg at 01/04/16 0959  . lithium carbonate capsule 300 mg  300 mg Oral BID WC Myer Peer Cobos, MD      . ondansetron (ZOFRAN) tablet 4 mg  4 mg Oral Q8H PRN Lurena Nida, NP   4 mg at 01/05/16 0425    Lab Results:  Results for orders placed or performed during the hospital encounter of 01/02/16 (from the past 48 hour(s))  TSH     Status: None   Collection Time: 01/04/16  6:14 AM  Result Value Ref Range   TSH 1.692 0.350 - 4.500 uIU/mL    Comment: Performed at [facility]  Prolactin     Status: None   Collection Time: 01/04/16  6:14 AM  Result Value Ref Range   Prolactin 7.7 4.0 - 15.2 ng/mL    Comment: (NOTE) Performed At: [facility] [place], [place] [id number] Lindon Romp MD [id number] Performed at [facility]   Lipid panel     Status: Abnormal   Collection Time: 01/04/16  6:14 AM  Result Value Ref Range   Cholesterol 219 (H) 0 - 200 mg/dL   Triglycerides 217 (H) <150 mg/dL   HDL 42 >40 mg/dL   Total CHOL/HDL Ratio 5.2 RATIO   VLDL 43 (H) 0 - 40 mg/dL   LDL Cholesterol 134 (H) 0 - 99 mg/dL    Comment:  Total Cholesterol/HDL:CHD Risk Coronary Heart Disease Risk Table                     Men   Women  1/2 Average Risk   3.4   3.3  Average Risk       5.0   4.4  2 X Average Risk   9.6   7.1  3 X Average Risk  23.4   11.0        Use the calculated Patient Ratio above and the CHD Risk Table to determine the patient's CHD Risk.        ATP III CLASSIFICATION (LDL):  <100     mg/dL   Optimal  100-129  mg/dL   Near or Above                    Optimal  130-159  mg/dL   Borderline  160-189  mg/dL   High  >190     mg/dL   Very High Performed at [facility]     Physical Findings: AIMS: Facial and Oral Movements Muscles of Facial Expression: None, normal Lips and Perioral Area: None, normal Jaw: None, normal Tongue: None, normal,Extremity Movements Upper (arms, wrists, hands, fingers): None, normal Lower (legs, knees, ankles, toes): None, normal, Trunk Movements Neck, shoulders, hips: None, normal, Overall Severity Severity of abnormal movements (highest score from questions above): None, normal Incapacitation due to abnormal movements: None, normal Patient's awareness of abnormal movements (rate only patient's report): No Awareness, Dental Status Current problems with teeth and/or dentures?: No Does patient usually wear dentures?: No  CIWA:    COWS:      Assessment/ Plan-   Patient presenting with mixed symptoms, but today more clearly jovial, expansive in affect, and reporting decreased need for sleep, racing thoughts. He also describes anxiety,and states he had an  episode of feeling apprehensive yesterday which he thinks was a panic attack. No side effects of Abilify.    Treatment Plan Summary: Continue inpatient treatment. Encourage ongoing group, milieu participation to work on coping skills and symptom reduction  Increase  Abilify to 15 mg daily for mood disorder  D/C SSRI due to concern antidepressant medication could be negatively affecting mood stability. Start Lithium Carbonate 300 mgrs BID for mood disorder, side effects and rationale for treatment reviewed .  Check EKG - routine    Neita Garnet, MD  3:39 PM  01/05/2016

## 2016-01-05 NOTE — Progress Notes (Signed)
Patient ID: Brent HorsfallSeth Jarvis, male   DOB: 05/24/1985, 30 y.o.   MRN: [id number] D: Client denies anxiety, depression. "I'm excited to start new medication, really need medication for sleep" Client laughs inappropriately, during interaction with Clinical research associatewriter. Client seen in dayroom interaction with peers. A: Writer review medications, administered as ordered. Staff will monitor q7815min for safety.R: Client is safe on the unit.

## 2016-01-06 LAB — HEMOGLOBIN A1C
Hgb A1c MFr Bld: 5.8 % — ABNORMAL HIGH (ref 4.8–5.6)
Mean Plasma Glucose: 120 mg/dL

## 2016-01-06 MED ORDER — LORAZEPAM 1 MG PO TABS
1.0000 mg | ORAL_TABLET | Freq: Four times a day (QID) | ORAL | Status: DC | PRN
  Administered 2016-01-06 – 2016-01-07 (×3): 1 mg via ORAL
  Filled 2016-01-06 (×3): qty 1

## 2016-01-06 MED ORDER — HYDROCHLOROTHIAZIDE 12.5 MG PO CAPS
12.5000 mg | ORAL_CAPSULE | Freq: Every day | ORAL | Status: DC
  Administered 2016-01-06 – 2016-01-08 (×3): 12.5 mg via ORAL
  Filled 2016-01-06 (×5): qty 1

## 2016-01-06 MED ORDER — ZOLPIDEM TARTRATE 5 MG PO TABS
5.0000 mg | ORAL_TABLET | Freq: Every evening | ORAL | Status: DC | PRN
  Administered 2016-01-06 – 2016-01-07 (×2): 5 mg via ORAL
  Filled 2016-01-06 (×2): qty 1

## 2016-01-06 MED ORDER — RISPERIDONE 1 MG PO TBDP
1.0000 mg | ORAL_TABLET | Freq: Once | ORAL | Status: AC
  Administered 2016-01-06: 1 mg via ORAL
  Filled 2016-01-06 (×2): qty 1

## 2016-01-06 MED ORDER — IBUPROFEN 600 MG PO TABS
600.0000 mg | ORAL_TABLET | Freq: Three times a day (TID) | ORAL | Status: DC | PRN
  Administered 2016-01-07: 600 mg via ORAL
  Filled 2016-01-06: qty 1

## 2016-01-06 MED ORDER — RISPERIDONE 1 MG PO TBDP: 1.0000 mg | ORAL_TABLET | Freq: Every day | ORAL | Status: DC

## 2016-01-06 NOTE — Progress Notes (Signed)
Adult Psychoeducational Group Note  Date:  01/06/2016 Time:  0900 am  Group Topic/Focus:  Orientation:   The focus of this group is to educate the patient on the purpose and policies of crisis stabilization and provide a format to answer questions about their admission.  The group details unit policies and expectations of patients while admitted.   Participation Level:  Minimal  Participation Quality:  Inattentive  Affect:  Irritable  Cognitive:  Lacking  Insight: Lacking  Engagement in Group:  Limited  Modes of Intervention:  Discussion and Education  Additional Comments:  Patient left out of group, stating that he doesn't feel well this morning.  Jigar Zielke L 01/06/2016, 9:47 AM

## 2016-01-06 NOTE — Progress Notes (Signed)
Adult Psychoeducational Group Note  Date:  01/06/2016 Time:  9:24 PM  Group Topic/Focus:  Wrap-Up Group:   The focus of this group is to help patients review their daily goal of treatment and discuss progress on daily workbooks.   Participation Level:  Active  Participation Quality:  Appropriate  Affect:  Appropriate  Cognitive:  Alert  Insight: Appropriate  Engagement in Group:  Engaged  Modes of Intervention:  Discussion  Additional Comments:  Patient states, "my day started off rocky, but ended up being great".  Dasie Chancellor L Mamadou Breon 01/06/2016, 9:24 PM

## 2016-01-06 NOTE — Progress Notes (Addendum)
Patient ID: Brent Rios, male   DOB: 1985/08/06, 30 y.o.   MRN: [id number] [facility] MD Progress Note  01/06/2016 4:35 PM Brent Rios  MRN:  [id number]   Subjective:  Patient reports he feels medications are helping , but continues to report and present with significant affective lability, anxiety. Complains of insomnia as major issue, states " I have not slept well in many days now".  He states he would want to discharge and that then he would go directly to the [facility] ED in order to get psychiatric admission there. States " they give me medications for sleep there, and the beds are better ".   Objective:    I have discussed case with treatment team and have met with patient.  As per staff, patient has remained labile in affect, anxious, somatically focused. He has had episodes of increased anxiety, agitation, and remains somewhat pressured in speech and tending to monopolize conversations. As noted, he does report he feels medications are helping, and has tolerated Abilify titration and Lithium trial well thus far . Today reports symptoms of dysuria, denies fever, no chills, reports diarrhea has improved . Denies medication side effects. No akathisia noted. BP has tended to be elevated- patient had been on HCTZ at 12.5 mgrs daily, denies side effects. Home medication list indicates he was also prescribed Lopressor , but was not taking this medication .    Principal Problem: Bipolar 1 disorder, mixed ([place]) Diagnosis:   Patient Active Problem List   Diagnosis Date Noted  . Bipolar 1 disorder, mixed, moderate ([place]) [F31.62] 01/02/2016  . Bipolar 1 disorder, mixed ([place]) [F31.60] 01/02/2016  . Suicidal ideation [R45.851]   . Generalized anxiety disorder [F41.1]    Total Time spent with patient: 25 minutes   Past Medical History:  Past Medical History:  Diagnosis Date  . Hypertension    History reviewed. No pertinent surgical history. Family History:  Family History  Problem Relation  Age of Onset  . Bipolar disorder Cousin    Social History:  History  Alcohol Use  . Yes    Comment: socially     History  Drug Use No    Social History   Social History  . Marital status: Single    Spouse name: N/A  . Number of children: N/A  . Years of education: N/A   Social History Main Topics  . Smoking status: Never Smoker  . Smokeless tobacco: Never Used  . Alcohol use Yes     Comment: socially  . Drug use: No  . Sexual activity: No   Other Topics Concern  . None   Social History Narrative  . None   Additional History:    Sleep:  Poor   Appetite:  Good   Musculoskeletal: Strength & Muscle Tone: within normal limits Gait & Station: normal Patient leans: N/A  Psychiatric Specialty Exam: Physical Exam  Nursing note and vitals reviewed.   Review of Systems  Gastrointestinal: Positive for diarrhea. Negative for abdominal pain, blood in stool, constipation, heartburn, melena, nausea and vomiting.  Musculoskeletal: Negative for back pain, joint pain, myalgias and neck pain.  Neurological: Negative.  Negative for dizziness, tingling, tremors, sensory change and speech change.  Psychiatric/Behavioral: Positive for depression. Negative for hallucinations, memory loss, substance abuse and suicidal ideas. The patient is nervous/anxious. The patient does not have insomnia.   All other systems reviewed and are negative. States diarrhea has improved, no vomiting , no dyspnea at this time   Blood  pressure (!) 157/95, pulse (!) 114, temperature 98.7 F (37.1 C), temperature source Oral, resp. rate 18, height '5\' 10"'$  (1.778 m), weight 244 lb 8 oz (110.9 kg), SpO2 94 %.Body mass index is 35.08 kg/m.  General Appearance: fairly groomed   Engineer, water::  Good  Speech:  Intermittently fast, pressured, but not loud in volume   Volume:  Normal  Mood:   Labile, remains manic   Affect:   Labile, expansive, anxious   Thought Process:  Linear - no flight of ideations noted    Orientation:  Full (Time, Place, and Person)  Thought Content: no hallucinations, no delusions , not internally preoccupied   Suicidal Thoughts:  No- at this time denies any active suicidal ideations, states he has chronic passive SI, currently contracts for safety on the unit   Homicidal Thoughts:  No- denies homicidal ideations at this time   Memory:  Immediate;   Good Recent;   Good Remote;   Good  Judgement: fair   Insight:   Fair   Psychomotor Activity: normal  Concentration:  Good  Recall:  Good  Fund of Knowledge:Good  Language: Good  Akathisia:  Negative  Handed:  Right  AIMS (if indicated):     Assets:  Resilience  ADL's:  Intact  Cognition: WNL  Sleep:  Poor     Current Medications: Current Facility-Administered Medications  Medication Dose Route Frequency Provider Last Rate Last Dose  . acetaminophen (TYLENOL) tablet 650 mg  650 mg Oral Q4H PRN Lurena Nida, NP   650 mg at 01/05/16 0855  . alum & mag hydroxide-simeth (MAALOX/MYLANTA) 200-200-20 MG/5ML suspension 30 mL  30 mL Oral PRN Lurena Nida, NP   30 mL at 01/04/16 1828  . ARIPiprazole (ABILIFY) tablet 15 mg  15 mg Oral Daily Jenne Campus, MD   15 mg at 01/05/16 2222  . ibuprofen (ADVIL,MOTRIN) tablet 600 mg  600 mg Oral Q8H PRN Kerrie Buffalo, NP      . lithium carbonate capsule 300 mg  300 mg Oral BID WC Jenne Campus, MD   300 mg at 01/06/16 0809  . LORazepam (ATIVAN) tablet 1 mg  1 mg Oral Q6H PRN Jenne Campus, MD   1 mg at 01/06/16 0954  . ondansetron (ZOFRAN) tablet 4 mg  4 mg Oral Q8H PRN Lurena Nida, NP   4 mg at 01/05/16 0425  . zolpidem (AMBIEN) tablet 5 mg  5 mg Oral QHS PRN Jenne Campus, MD        Lab Results:  No results found for this or any previous visit (from the past 48 hour(s)).  Physical Findings: AIMS: Facial and Oral Movements Muscles of Facial Expression: None, normal Lips and Perioral Area: None, normal Jaw: None, normal Tongue: None, normal,Extremity  Movements Upper (arms, wrists, hands, fingers): None, normal Lower (legs, knees, ankles, toes): None, normal, Trunk Movements Neck, shoulders, hips: None, normal, Overall Severity Severity of abnormal movements (highest score from questions above): None, normal Incapacitation due to abnormal movements: None, normal Patient's awareness of abnormal movements (rate only patient's report): No Awareness, Dental Status Current problems with teeth and/or dentures?: No Does patient usually wear dentures?: No  CIWA:    COWS:      Assessment/ Plan-   Patient continues to present intermittently anxious, agitated, and continues to present with symptoms of mania, including  poor sleep, tendency to pressured speech, expansive affect . Thus far tolerating Lithium trial well and has tolerated Abilify titration  well - no akathisia at this time .  Treatment Plan Summary: Continue inpatient treatment.- will discuss patient's request to go to [facility] with team and CSW  Encourage ongoing group, milieu participation to work on coping skills and symptom reduction  Continue Abilify 15 mg daily for mood disorder  Continue  Lithium Carbonate 300 mgrs BID for mood disorder, side effects and rationale for treatment reviewed .  Start Ativan 1 mgrs Q 6 hours PRN for anxiety, agitation  Discontinue Risperidone, (which was started last night, for insomnia.) We discussed insomnia issues, patient states that he has been on several sleeping medications and that only Ambien worked well for him, denies side effects from this medication - start Ambien 5 mgrs QHS PRN for insomnia  Start HCTZ 12.5 mgrs QDAY for management of HTN Check UA as patient reports dysuria    Neita Garnet, MD  4:35 PM  01/06/2016

## 2016-01-06 NOTE — Progress Notes (Signed)
Patient ID: Brent Rios, male   DOB: 10/06/1985, 30 y.o.   MRN: [id number] D: Client visible on the unit, reports "headache much better, hoping I can rest tonight" A: Writer provided emotional support, reviewed medications and changes, administered as ordered. Staff will monitor q8115min for safety. R:Client is safe on the unit, attended group.

## 2016-01-06 NOTE — Progress Notes (Signed)
Patient ID: Brent Rios, male   DOB: 04/20/1986, 30 y.o.   MRN: [id number] D: Client up reporting inability to sleep after being given Diphenhydramine 50 mg twice. Reports to [place] he wants EMS or 911 called so he can go to [facility], just get out of here. A: Writer speaks to client to assess his needs. Client reports "I just can't sleep. Client appears to being much not to sleep ie, asking for heat packs, extra pillow. "I don't feel safe" A: Writer staffed with Lonell Grandchild PA received order for Risperdal 1 mg oral. Client asking "what if this doesn't work" as he is ingesting that pill. Client is exhibiting childlike behavior, very attention seeking, appears to be fighting sleep. Writer sits at clients door to make him feel safe. Client continues to move around in bed and make noises for attention, moving about in bed. "I can't remember to breathe" "my feet went numb" "can I get another hot pack for my neck" "you think it would be helpful to take another shower" Client continues to clear throat, and thrash about in bed for attention. "my grandmother met my grandfather in the hospital" then mumbles. R: Writer continues to keep  conversation to a minimal to promote sleep.

## 2016-01-06 NOTE — [facility] Group Notes (Signed)
[facility] LCSW Group Therapy 01/06/2016 1:15 PM  Type of Therapy: Group Therapy- Feelings about Diagnosis  Participation Level: Active   Participation Quality:  Monopolizing  Affect:  Appropriate  Cognitive: Alert and Oriented   Insight:  Developing   Engagement in Therapy: Developing/Improving and Engaged   Modes of Intervention: Clarification, Confrontation, Discussion, Education, Exploration, Limit-setting, Orientation, Problem-solving, Rapport Building, Dance movement psychotherapisteality Testing, Socialization and Support  Description of Group:   This group will allow patients to explore their thoughts and feelings about diagnoses they have received. Patients will be guided to explore their level of understanding and acceptance of these diagnoses. Facilitator will encourage patients to process their thoughts and feelings about the reactions of others to their diagnosis, and will guide patients in identifying ways to discuss their diagnosis with significant others in their lives. This group will be process-oriented, with patients participating in exploration of their own experiences as well as giving and receiving support and challenge from other group members.  Summary of Progress/Problems:  Pt monopolized group discussion, but was encouraging and appropriate in his participation. Pt's comments are often vague but somewhat related to the topic.   Therapeutic Modalities:   Cognitive Behavioral Therapy Solution Focused Therapy Motivational Interviewing Relapse Prevention Therapy  Chad CordialLauren Carter, LCSWA 01/06/2016 4:41 PM

## 2016-01-06 NOTE — Progress Notes (Signed)
[facility] Group Notes:  (Nursing/MHT/Case Management/Adjunct)  Date:  01/06/2016  Time:  12:39 AM  Type of Therapy:  Psychoeducational Skills  Participation Level:  Active  Participation Quality:  Attentive  Affect:  Appropriate  Cognitive:  Lacking  Insight:  Lacking  Engagement in Group:  Developing/Improving  Modes of Intervention:  Education  Summary of Progress/Problems: The patient verbalized that he had a "great day" and that he was able to socialize with his peers. As a theme for the day, his wellness strategy will be to get more exercise.   Hazle CocaGOODMAN, Rosetta Rupnow S 01/06/2016, 12:39 AM

## 2016-01-06 NOTE — Progress Notes (Signed)
Patient ID: Brent Rios, male   DOB: 10/08/1985, 30 y.o.   MRN: [id number] D: Patient [facility] to be [facility]  His goal is to "[facility]."  He complains of severe anxiety this morning. Patient introduced himself his morning as "the troublemaker."    He [place] an [place].  Informed patient that he would not receive an ambien until bedtime.  He was given ativan 1 mg for MD order for his anxiety.  He denies any depressive symptoms.  He rates his depression and anxiety as a 1.  Hopelessness is rated a 0.  He continues to have poor sleep, even though he reported he slept well last night.  Patient turned in a diluted urine for culture.  Requested that he give me another one, which was as diluted as the first.  Patient states, "I'm anxious over my mother." Patient is having family conflict.  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  He denies HI/AVH. A: Continue to monitor medication management and MD orders.  Safety checks completed every 15 minutes per protocol.  Offer support and encouragement as needed. R: Patient needs redirection at times.

## 2016-01-07 LAB — URINALYSIS, ROUTINE W REFLEX MICROSCOPIC
Bilirubin Urine: NEGATIVE
Glucose, UA: NEGATIVE mg/dL
HGB URINE DIPSTICK: NEGATIVE
Ketones, ur: NEGATIVE mg/dL
LEUKOCYTES UA: NEGATIVE
NITRITE: NEGATIVE
PROTEIN: NEGATIVE mg/dL
SPECIFIC GRAVITY, URINE: 1.005 (ref 1.005–1.030)
pH: 7 (ref 5.0–8.0)

## 2016-01-07 NOTE — Progress Notes (Signed)
Recreation Therapy Notes  Date: 01/07/16 Time: 0930 Location: [address] Group Room  Group Topic: Stress Management  Goal Area(s) Addresses:  Patient will verbalize importance of using healthy stress management.  Patient will identify positive emotions associated with healthy stress management.   Behavioral Response: Engaged  Intervention: Stress Management  Activity :  Floating on a Cloud.  LRT introduced the technique of guided imagery to the patients.  Patients were asked to follow along as LRT read the script to participate in the activity.  Education: Stress Management, Discharge Planning.   Education Outcome: Acknowledges edcuation/In group clarification offered/Needs additional education  Clinical Observations/Feedback:  Pt attended group.       Caroll RancherMarjette Millissa Deese, LRT/CTRS    Caroll RancherLindsay, Aubriee Szeto A 01/07/2016 12:08 PM

## 2016-01-07 NOTE — [facility] Group Notes (Signed)
[facility] LCSW Group Therapy 01/07/2016 1:15 PM  Type of Therapy: Group Therapy- Emotion Regulation  Participation Level: Active   Participation Quality:  Appropriate  Affect: Appropriate  Cognitive: Alert and Oriented   Insight:  Developing/Improving  Engagement in Therapy: Developing/Improving and Engaged   Modes of Intervention: Clarification, Confrontation, Discussion, Education, Exploration, Limit-setting, Orientation, Problem-solving, Rapport Building, Dance movement psychotherapisteality Testing, Socialization and Support  Summary of Progress/Problems: The topic for group today was emotional regulation. This group focused on both positive and negative emotion identification and allowed group members to process ways to identify feelings, regulate negative emotions, and find healthy ways to manage internal/external emotions. Group members were asked to reflect on a time when their reaction to an emotion led to a negative outcome and explored how alternative responses using emotion regulation would have benefited them. Group members were also asked to discuss a time when emotion regulation was utilized when a negative emotion was experienced. Pt was less intrusive today and more attentive. He continues to have somewhat vague speech but interacted more appropriately with peers.    Brent CordialLauren Carter, LCSWA 01/07/2016 5:14 PM

## 2016-01-07 NOTE — Progress Notes (Signed)
Patient ID: Brent Rios, male   DOB: [birth year], 30 y.o.   MRN: [id number] [facility] MD Progress Note  01/07/2016 3:09 PM Brent Rios  MRN:  [id number]   Subjective:  Patient states that he has been feeling better and states " I feel like I have turned a corner ". States that " finally I got a good night's sleep last night ", and states he was able to nap earlier today as well. Attributes his improving mood to improved sleep. He denies medication side effects and states " I think they are working well for me right now". At this time he is more focused on disposition planning, and is wanting to discharge soon.    Objective:    I have discussed case with treatment team and have met with patient.  As discussed with staff, patient still labile in affect, but improved, redirectable, no agitated episodes . Patient presents better, with a less pressured speech, less expansive affect, no longer presenting with frequent laughter as he had presented with initially, less anxious .  States he slept " about 6 hours last night". Attributes this to medications " working better for me now" and to Ambien . Denies medication side effects. States headache, which he had  yesterday, has subsided . Denies headache at this time .  BP improved today, on HCTZ, which he takes at home without side effects.   Principal Problem: Bipolar 1 disorder, mixed ([place]) Diagnosis:   Patient Active Problem List   Diagnosis Date Noted  . Bipolar 1 disorder, mixed, moderate ([place]) [F31.62] 01/02/2016  . Bipolar 1 disorder, mixed ([place]) [F31.60] 01/02/2016  . Suicidal ideation [R45.851]   . Generalized anxiety disorder [F41.1]    Total Time spent with patient: 25 minutes   Past Medical History:  Past Medical History:  Diagnosis Date  . Hypertension    History reviewed. No pertinent surgical history. Family History:  Family History  Problem Relation Age of Onset  . Bipolar disorder Cousin    Social History:  History  Alcohol Use   . Yes    Comment: socially     History  Drug Use No    Social History   Social History  . Marital status: Single    Spouse name: N/A  . Number of children: N/A  . Years of education: N/A   Social History Main Topics  . Smoking status: Never Smoker  . Smokeless tobacco: Never Used  . Alcohol use Yes     Comment: socially  . Drug use: No  . Sexual activity: No   Other Topics Concern  . None   Social History Narrative  . None   Additional History:    Sleep:  Improving   Appetite:  Good   Musculoskeletal: Strength & Muscle Tone: within normal limits Gait & Station: normal Patient leans: N/A  Psychiatric Specialty Exam: Physical Exam  Nursing note and vitals reviewed.   Review of Systems  Gastrointestinal: Positive for diarrhea. Negative for abdominal pain, blood in stool, constipation, heartburn, melena, nausea and vomiting.  Musculoskeletal: Negative for back pain, joint pain, myalgias and neck pain.  Neurological: Negative.  Negative for dizziness, tingling, tremors, sensory change and speech change.  Psychiatric/Behavioral: Positive for depression. Negative for hallucinations, memory loss, substance abuse and suicidal ideas. The patient is nervous/anxious. The patient does not have insomnia.   All other systems reviewed and are negative. States  headache improved, diarrhea has improved, no vomiting , no dyspnea at this time , no tremors,  no rash- today does not endorse dysuria, urgency, fever or chills   Blood pressure 122/85, pulse 100, temperature 97.7 F (36.5 C), temperature source Oral, resp. rate 16, height '5\' 10"'$  (1.778 m), weight 244 lb 8 oz (110.9 kg), SpO2 94 %.Body mass index is 35.08 kg/m.  General Appearance: improved grooming   Eye Contact::  Good  Speech:   Improved, not pressured at this time  Volume:  Normal  Mood:  Improved mood , less manic, calmer   Affect:   Improved, less labile   Thought Process:  Linear   Orientation:  Full (Time,  Place, and Person)  Thought Content: no hallucinations, no delusions , not internally preoccupied   Suicidal Thoughts:  No- at this time denies any active suicidal ideations, states he has chronic passive SI, currently contracts for safety on the unit   Homicidal Thoughts:  No- denies homicidal ideations at this time   Memory:  Immediate;   Good Recent;   Good Remote;   Good  Judgement: improving   Insight:   Improving   Psychomotor Activity: normal  Concentration:  Good  Recall:  Good  Fund of Knowledge:Good  Language: Good  Akathisia:  Negative  Handed:  Right  AIMS (if indicated):     Assets:  Resilience  ADL's:  Intact  Cognition: WNL  Sleep:  Poor     Current Medications: Current Facility-Administered Medications  Medication Dose Route Frequency Provider Last Rate Last Dose  . acetaminophen (TYLENOL) tablet 650 mg  650 mg Oral Q4H PRN Lurena Nida, NP   650 mg at 01/07/16 1040  . alum & mag hydroxide-simeth (MAALOX/MYLANTA) 200-200-20 MG/5ML suspension 30 mL  30 mL Oral PRN Lurena Nida, NP   30 mL at 01/04/16 1828  . ARIPiprazole (ABILIFY) tablet 15 mg  15 mg Oral Daily Jenne Campus, MD   15 mg at 01/06/16 2150  . hydrochlorothiazide (MICROZIDE) capsule 12.5 mg  12.5 mg Oral Daily Jenne Campus, MD   12.5 mg at 01/07/16 9371  . ibuprofen (ADVIL,MOTRIN) tablet 600 mg  600 mg Oral Q8H PRN Kerrie Buffalo, NP      . lithium carbonate capsule 300 mg  300 mg Oral BID WC Jenne Campus, MD   300 mg at 01/07/16 6967  . LORazepam (ATIVAN) tablet 1 mg  1 mg Oral Q6H PRN Jenne Campus, MD   1 mg at 01/07/16 0248  . ondansetron (ZOFRAN) tablet 4 mg  4 mg Oral Q8H PRN Lurena Nida, NP   4 mg at 01/05/16 0425  . zolpidem (AMBIEN) tablet 5 mg  5 mg Oral QHS PRN Jenne Campus, MD   5 mg at 01/06/16 2150    Lab Results:  No results found for this or any previous visit (from the past 48 hour(s)).  Physical Findings: AIMS: Facial and Oral Movements Muscles of Facial  Expression: None, normal Lips and Perioral Area: None, normal Jaw: None, normal Tongue: None, normal,Extremity Movements Upper (arms, wrists, hands, fingers): None, normal Lower (legs, knees, ankles, toes): None, normal, Trunk Movements Neck, shoulders, hips: None, normal, Overall Severity Severity of abnormal movements (highest score from questions above): None, normal Incapacitation due to abnormal movements: None, normal Patient's awareness of abnormal movements (rate only patient's report): No Awareness, Dental Status Current problems with teeth and/or dentures?: No Does patient usually wear dentures?: No  CIWA:    COWS:      Assessment/ Plan-   Patient reports improving mood ,  states he feels better, slept better last night, and feels his manic symptoms are subsiding- he does present calmer, less animated and jovial, less labile, and pressured speech has resolved. Improved sleep- states slept well last night. Denies medication side effects- currently focused on being discharged soon.  Treatment Plan Summary: Continue inpatient treatment.- will discuss patient's request to go to [facility] with team and CSW  Encourage ongoing group, milieu participation to work on coping skills and symptom reduction  Continue Abilify 15 mg daily for mood disorder  Continue  Lithium Carbonate 300 mgrs BID for mood disorder, side effects and rationale for treatment reviewed .  Continue Ativan 1 mgrs Q 6 hours PRN for anxiety, agitation  Continue  Ambien 5 mgrs QHS PRN for insomnia  Continue HCTZ 12.5 mgrs QDAY for management of HTN Treatment team working on disposition planning options    Neita Garnet, MD  3:09 PM  01/07/2016

## 2016-01-07 NOTE — Progress Notes (Signed)
D: Pt presents animated and anxious on approach. Pt silly and childlike during the shift. Pt can be intrusive and requires redirecting by staff for offending other pts on the unit. Pt easily to redirectable. Pt reports improving mood today. Pt reports no anger episodes today and yesterday was the last time he felt easily angered. Pt reports improved sleep last night and decreased depression this morning. Pt compliant with taking meds and attending groups. No complaints verbalized by pt.  A: Medications reviewed with pt. Medications administered as ordered per MD. Verbal support provided. Pt encouraged to attend groups. 15 minute checks performed for safety.  R: Pt stated goal "getting back home and discuss discharge plans". Pt receptive to tx.

## 2016-01-07 NOTE — Progress Notes (Signed)
Adult Psychoeducational Group Note  Date:  01/07/2016 Time:  9:10 PM  Group Topic/Focus:  Wrap-Up Group:   The focus of this group is to help patients review their daily goal of treatment and discuss progress on daily workbooks.   Participation Level:  Did Not Attend  Participation Quality:  Did not attend  Affect:  Did not attend  Cognitive:  Did not attend  Insight: None  Engagement in Group:  Did not attend  Modes of Intervention:  Did not attend  Additional Comments:  Patient did not attend wrap up group tonight. Nabeel Gladson L Tigerlily Christine 01/07/2016, 9:10 PM

## 2016-01-08 LAB — LITHIUM LEVEL: Lithium Lvl: 0.2 mmol/L — ABNORMAL LOW (ref 0.60–1.20)

## 2016-01-08 MED ORDER — ARIPIPRAZOLE 15 MG PO TABS: 15.0000 mg | ORAL_TABLET | Freq: Every day | ORAL | 0 refills | Status: AC

## 2016-01-08 MED ORDER — ZOLPIDEM TARTRATE 5 MG PO TABS: 5.0000 mg | ORAL_TABLET | Freq: Every evening | ORAL | 0 refills | Status: AC | PRN

## 2016-01-08 MED ORDER — LITHIUM CARBONATE 600 MG PO CAPS: 600.0000 mg | ORAL_CAPSULE | Freq: Every day | ORAL | 0 refills | Status: AC

## 2016-01-08 MED ORDER — LITHIUM CARBONATE 300 MG PO CAPS
300.0000 mg | ORAL_CAPSULE | Freq: Every morning | ORAL | Status: DC
  Filled 2016-01-08: qty 7
  Filled 2016-01-08: qty 1

## 2016-01-08 MED ORDER — LITHIUM CARBONATE 300 MG PO CAPS: 300.0000 mg | ORAL_CAPSULE | Freq: Two times a day (BID) | ORAL | 0 refills | Status: DC

## 2016-01-08 MED ORDER — HYDROCHLOROTHIAZIDE 12.5 MG PO CAPS: 12.5000 mg | ORAL_CAPSULE | Freq: Every day | ORAL | 0 refills | Status: AC

## 2016-01-08 MED ORDER — LITHIUM CARBONATE 300 MG PO CAPS
600.0000 mg | ORAL_CAPSULE | Freq: Every day | ORAL | Status: DC
  Filled 2016-01-08: qty 14
  Filled 2016-01-08: qty 2

## 2016-01-08 MED ORDER — LITHIUM CARBONATE 300 MG PO CAPS: 300.0000 mg | ORAL_CAPSULE | Freq: Every morning | ORAL | 0 refills | Status: AC

## 2016-01-08 NOTE — Progress Notes (Signed)
  [facility]  Will you be returning to the same living situation after discharge:  Yes,  Pt returning home with family At discharge, do you have transportation home?: Yes,  Pt family to pick up Do you have the ability to pay for your medications: Yes,  Pt provided with prescriptions and samples  Release of information consent forms completed and in the chart;  Patient's signature needed at discharge.  Patient to Follow up at: Follow-up Information    [facility] .   Specialty:  Professional Counselor Why:  Pt plans to schedule/confirm his own therapy and medication management appointments with his established providers.  Contact information: [address] [place] [address] [phone]           Next level of care provider has access to [facility] Link:no  Safety Planning and Suicide Prevention discussed: Yes,  with Pt; 2 unsuccessful attempts made with mother  Have you used any form of tobacco in the last 30 days? (Cigarettes, Smokeless Tobacco, Cigars, and/or Pipes): No  Has patient been referred to the Quitline?: N/A patient is not a smoker  Patient has been referred for addiction treatment: Yes  Elliona Doddridge Lavonna RuaM Carter 01/08/2016, 3:03 PM

## 2016-01-08 NOTE — Progress Notes (Signed)
Patient ID: Brent HorsfallSeth Goold, male   DOB: 11/01/1985, 30 y.o.   MRN: [id number] Discharge note:  Patient discharged home per MD order.  Patient received all personal belongings from unit and locker.  Patient denies any thoughts of self harm.  He denies HI/AVH.  Patient will follow up with his outpatient provider upon discharge.  Reviewed discharge instructions/AVS with patient.  Reviewed medications and patient was given samples.  Patient received prescriptions.  Patient indicated understanding of all instructions.  He left ambulatory with his ride.

## 2016-01-08 NOTE — Discharge Summary (Signed)
Physician Discharge Summary Note  Patient:  Brent Rios Stuber is an 30 y.o., male MRN:  [id number] DOB:  08/14/1985 Patient phone:  [phone] (home)  Patient address:   [address] [place] [address],  Total Time spent with patient: 30 minutes  Date of Admission:  01/02/2016 Date of Discharge: 01/08/2016  Reason for Admission:  Depressed and suicidal  Principal Problem: Bipolar 1 disorder, mixed [facility](HCC) Discharge Diagnoses: Patient Active Problem List   Diagnosis Date Noted  . Bipolar 1 disorder, mixed, moderate ([facility]) [F31.62] 01/02/2016  . Bipolar 1 disorder, mixed ([facility]) [F31.60] 01/02/2016  . Suicidal ideation [R45.851]   . Generalized anxiety disorder [F41.1]     Past Psychiatric History: see HPI  Past Medical History:  Past Medical History:  Diagnosis Date  . Hypertension    History reviewed. No pertinent surgical history. Family History:  Family History  Problem Relation Age of Onset  . Bipolar disorder Cousin    Family Psychiatric  History: see HPI Social History:  History  Alcohol Use  . Yes    Comment: socially     History  Drug Use No    Social History   Social History  . Marital status: Single    Spouse name: N/A  . Number of children: N/A  . Years of education: N/A   Social History Main Topics  . Smoking status: Never Smoker  . Smokeless tobacco: Never Used  . Alcohol use Yes     Comment: socially  . Drug use: No  . Sexual activity: No   Other Topics Concern  . None   Social History Narrative  . None    Hospital Course:  Brent Rios a 30 y.o.malepatient admitted with Depression and suicidal ideations.  He reported gradual worsening anxiety and depression, with recent onset of suicidal ideations, in the context of severe psychosocial/family stressors.  Brent Rios was admitted for Bipolar 1 disorder, mixed (HCC) and crisis management.  He was treated with medications listed below with their indications.  Medical problems were  identified and treated as needed.  Home medications were restarted as appropriate.  Improvement was monitored by observation and Brent Rios daily report of symptom reduction.  Emotional and mental status was monitored by daily self inventory reports completed by Brent Rios and clinical staff.  Patient reported continued improvement, denied any new concerns.  Patient had been compliant on medications and denied side effects.  Support and encouragement was provided.    At time of discharge, patient rated both depression and anxiety levels to be manageable and minimal.  Patient encouraged to attend groups to help with recognizing triggers of emotional crises and de-stabilizations.  Patient encouraged to attend group to help identify the positive things in life that would help in dealing with feelings of loss, depression and unhealthy or abusive tendencies.         Brent Rios [place] was evaluated by the treatment team for stability and plans for continued recovery upon discharge.  He was offered further treatment options upon discharge including Residential, Intensive Outpatient and Outpatient treatment. He will follow up with agencies listed below for medication management and counseling.  Encouraged patient to maintain satisfactory support network and home environment.  Advised to adhere to medication compliance and outpatient treatment follow up.  Prescriptions provided.       Brent Rios motivation was an integral factor for scheduling further treatment.  Employment, transportation, bed availability, health status, family support, and any pending legal issues were also considered during his  hospital stay.  Upon completion of this admission the patient was both mentally and medically stable for discharge denying suicidal/homicidal ideation, auditory/visual/tactile hallucinations, delusional thoughts and paranoia.       Physical Findings: AIMS: Facial and Oral Movements Muscles of Facial Expression: None,  normal Lips and Perioral Area: None, normal Jaw: None, normal Tongue: None, normal,Extremity Movements Upper (arms, wrists, hands, fingers): None, normal Lower (legs, knees, ankles, toes): None, normal, Trunk Movements Neck, shoulders, hips: None, normal, Overall Severity Severity of abnormal movements (highest score from questions above): None, normal Incapacitation due to abnormal movements: None, normal Patient's awareness of abnormal movements (rate only patient's report): No Awareness, Dental Status Current problems with teeth and/or dentures?: No Does patient usually wear dentures?: No  CIWA:    COWS:     Musculoskeletal: Strength & Muscle Tone: within normal limits Gait & Station: normal Patient leans: N/A  Psychiatric Specialty Exam: Physical Exam  Nursing note and vitals reviewed. Psychiatric: He has a normal mood and affect. His speech is normal and behavior is normal. Judgment and thought content normal. Cognition and memory are normal.    Review of Systems  Constitutional: Negative.   HENT: Negative.   Eyes: Negative.   Respiratory: Negative.   Cardiovascular: Negative.   Gastrointestinal: Negative.   Genitourinary: Negative.   Musculoskeletal: Negative.   Skin: Negative.   Neurological: Negative.   Endo/Heme/Allergies: Negative.   Psychiatric/Behavioral: Negative.   All other systems reviewed and are negative.   Blood pressure 135/83, pulse 98, temperature 97.4 F (36.3 C), temperature source Oral, resp. rate 16, height 5\' 10"  (1.778 m), weight 110.9 kg (244 lb 8 oz), SpO2 94 %.Body mass index is 35.08 kg/m.   Have you used any form of tobacco in the last 30 days? (Cigarettes, Smokeless Tobacco, Cigars, and/or Pipes): No  Has this patient used any form of tobacco in the last 30 days? (Cigarettes, Smokeless Tobacco, Cigars, and/or Pipes) Yes, N/A  Blood Alcohol level:  Lab Results  Component Value Date   ETH <5 01/01/2016   ETH <5 10/13/2014     Metabolic Disorder Labs:  Lab Results  Component Value Date   HGBA1C 5.8 (H) 01/04/2016   MPG 120 01/04/2016   Lab Results  Component Value Date   PROLACTIN 7.7 01/04/2016   Lab Results  Component Value Date   CHOL 219 (H) 01/04/2016   TRIG 217 (H) 01/04/2016   HDL 42 01/04/2016   CHOLHDL 5.2 01/04/2016   VLDL 43 (H) 01/04/2016   LDLCALC 134 (H) 01/04/2016    See Psychiatric Specialty Exam and Suicide Risk Assessment completed by Attending Physician prior to discharge.  Discharge destination:  Home  Is patient on multiple antipsychotic therapies at discharge:  No   Has Patient had three or more failed trials of antipsychotic monotherapy by history:  No  Recommended Plan for Multiple Antipsychotic Therapies: NA     Medication List    STOP taking these medications   acetaminophen 500 MG tablet Commonly known as:  TYLENOL   ADVIL PM 200-25 MG Caps Generic drug:  Ibuprofen-Diphenhydramine HCl   albuterol 108 (90 Base) MCG/ACT inhaler Commonly known as:  PROVENTIL HFA;VENTOLIN HFA   citalopram 20 MG tablet Commonly known as:  CELEXA   citalopram 40 MG tablet Commonly known as:  CELEXA   fluticasone 50 MCG/ACT nasal spray Commonly known as:  FLONASE   gabapentin 300 MG capsule Commonly known as:  NEURONTIN   ibuprofen 200 MG tablet Commonly known as:  ADVIL,MOTRIN  loratadine 10 MG tablet Commonly known as:  CLARITIN   LORazepam 0.5 MG tablet Commonly known as:  ATIVAN   metoprolol 50 MG tablet Commonly known as:  LOPRESSOR   mirtazapine 15 MG tablet Commonly known as:  REMERON   NYQUIL PO   traZODone 100 MG tablet Commonly known as:  DESYREL   traZODone 50 MG tablet Commonly known as:  DESYREL     TAKE these medications     Indication  ARIPiprazole 15 MG tablet Commonly known as:  ABILIFY Take 1 tablet (15 mg total) by mouth daily.  Indication:  mood stabilization   hydrochlorothiazide 12.5 MG capsule Commonly known as:   MICROZIDE Take 1 capsule (12.5 mg total) by mouth daily. What changed:  Another medication with the same name was removed. Continue taking this medication, and follow the directions you see here.  Indication:  High Blood Pressure Disorder   lithium 600 MG capsule Take 1 capsule (600 mg total) by mouth at bedtime.  Indication:  mood stabilization   lithium carbonate 300 MG capsule Take 1 capsule (300 mg total) by mouth every morning. Start taking on:  01/09/2016  Indication:  mood stabilization   zolpidem 5 MG tablet Commonly known as:  AMBIEN Take 1 tablet (5 mg total) by mouth at bedtime as needed for sleep.  Indication:  Trouble Sleeping      Follow-up recommendations:  Activity:  as tol Diet:  as tol  Comments:  1.  Take all your medications as prescribed.   2.  Report any adverse side effects to outpatient provider. 3.  Patient instructed to not use alcohol or illegal drugs while on prescription medicines. 4.  In the event of worsening symptoms, instructed patient to call 911, the crisis hotline or go to nearest emergency room for evaluation of symptoms.  Signed: Lindwood Qua, NP [facility] 01/08/2016, 11:17 AM   Patient seen, Suicide Assessment Completed.  Disposition Plan Reviewed

## 2016-01-08 NOTE — [facility] Suicide Risk Assessment (Addendum)
[facility]   Principal Problem: Bipolar 1 disorder, mixed [facility](HCC) Discharge Diagnoses:  Patient Active Problem List   Diagnosis Date Noted  . Bipolar 1 disorder, mixed, moderate (HCC) [F31.62] 01/02/2016  . Bipolar 1 disorder, mixed (HCC) [F31.60] 01/02/2016  . Suicidal ideation [R45.851]   . Generalized anxiety disorder [F41.1]     Total Time spent with patient: 30 minutes  Musculoskeletal: Strength & Muscle Tone: within normal limits Gait & Station: normal Patient leans: N/A  Psychiatric Specialty Exam: ROS headache has improved, denies chest pain, no shortness of breath, no tremors , no nausea or vomiting   Blood pressure 135/83, pulse 98, temperature 97.4 F (36.3 C), temperature source Oral, resp. rate 16, height 5\' 10"  (1.778 m), weight 244 lb 8 oz (110.9 kg), SpO2 94 %.Body mass index is 35.08 kg/m.  General Appearance: Well Groomed  Eye Contact::  Good  Speech:  Normal Rate409  Volume:  Normal  Mood:  improving mood , improved range of affect , at this time identifies his mood as " stable "  Affect:  appropriate, not expansive or irritable at this time   Thought Process:  Linear  Orientation:  Full (Time, Place, and Person)  Thought Content:  denies hallucinations, no delusions, not internally preoccupied   Suicidal Thoughts:  No- denies any suicidal ideations, no self injurious ideations, denies homicidal or violent ideations   Homicidal Thoughts:  No  Memory:  recent and remote grossly intact   Judgement:  Improving   Insight:  Improving   Psychomotor Activity:  Normal  Concentration:  Good  Recall:  Good  Fund of Knowledge:Good  Language: Good  Akathisia:  Negative  Handed:  Right  AIMS (if indicated):   no abnormal or involuntary movements noted or reported   Assets:  Desire for Improvement Resilience  Sleep:   Reports he slept better, about 6 hours last night   Cognition: WNL  ADL's:  Intact   Mental Status Per Nursing  Assessment::   On Admission:  NA  Demographic Factors:  30 year old single male , no children, lives with family  Loss Factors: Financial difficulties   Historical Factors: History of Mood disorder, and recent manic episode   Risk Reduction Factors:   Sense of responsibility to family, Living with another person, especially a relative and Positive coping skills or problem solving skills  Continued Clinical Symptoms:  At this time patient is improved- presents alert, attentive, calmer, no pressured speech,  mood improved, affect improved , and not currently presenting with expansive or irritable affect - thought process is linear, no thought disorder, no flight of ideations , no suicidal or homicidal ideations, no psychotic symptoms, future oriented. At this time is tolerating medications well - denies side effects - we have reviewed side effect profile for medications, including lithium, and discussed symptoms of toxicity and importance of outpatient follow up and periodic blood draws for  Monitoring . At this time his lithium serum level is low - 0.2 - no side effects , we agreed to titrate dose to 300 mgrs QAM and 600 mgrs QHS   Cognitive Features That Contribute To Risk:  No gross cognitive deficits noted upon discharge. Is alert , attentive, and oriented x 3   Suicide Risk:  Mild:  Suicidal ideation of limited frequency, intensity, duration, and specificity.  There are no identifiable plans, no associated intent, mild dysphoria and related symptoms, good self-control (both objective and subjective assessment), few other risk factors, and  identifiable protective factors, including available and accessible social support.    Plan Of Care/Follow-up recommendations:  Activity:  as tolerated  Diet:  Regular  Tests:  NA Other:  See below  Patient is leaving unit in good spirits  Plans to return home . Patient will follow up with outpatient psychiatrist for ongoing treatment . Nehemiah Massed, MD 01/08/2016, 10:41 AM

## 2016-01-08 NOTE — Tx Team (Signed)
Interdisciplinary Treatment Plan Update (Adult) Date: 01/08/2016   Date: 01/08/2016 2:58 PM  Progress in Treatment:  Attending groups: Yes  Participating in groups: Yes  Taking medication as prescribed: Yes  Tolerating medication: Yes  Family/Significant othe contact made: No, 2 unsuccessful attempts with mother Patient understands diagnosis: Continuing to assess Discussing patient identified problems/goals with staff: Yes  Medical problems stabilized or resolved: Yes  Denies suicidal/homicidal ideation:Yes Patient has not harmed self or Others: Yes   New problem(s) identified: None identified at this time.   Discharge Plan or Barriers: CSW will assess for appropriate discharge plan and relevant barriers.   01/08/2016: Pt will return home and follow-up with outpatient services.   Additional comments:  Patient and CSW reviewed pt's identified goals and treatment plan. Patient verbalized understanding and agreed to treatment plan.   Reason for Continuation of Hospitalization:  Depression Medication stabilization Suicidal ideation Mania  Estimated length of stay: 0 days  Review of initial/current patient goals per problem list:   1.  Goal(s): Patient will participate in aftercare plan  Met:  Yes  Target date: 3-5 days from date of admission   As evidenced by: Patient will participate within aftercare plan AEB aftercare provider and housing plan at discharge being identified.  01/05/16: CSW to work with Pt to assess for appropriate discharge plan and faciliate appointments and referrals as needed prior to d/c. 01/08/2016: Pt will return home and follow-up with outpatient services.   2.  Goal (s): Patient will exhibit decreased depressive symptoms and suicidal ideations.  Met:  Yes  Target date: 3-5 days from date of admission   As evidenced by: Patient will utilize self rating of depression at 3 or below and demonstrate decreased signs of depression or be deemed stable for  discharge by MD. 01/05/16: Pt was admitted with symptoms of depression, rating 10/10. Pt continues to present with flat affect and depressive symptoms.  Pt will demonstrate decreased symptoms of depression and rate depression at 3/10 or lower prior to discharge. 01/08/2016: Pt rates depression at 0/10; denies SI  6. Goal (s):  Patient will demonstrate decreased signs of mania . Met:  Adequate for DC . Target date: 3-5 days from date of admission  . As evidenced by:  Patient demonstrate decreased signs of mania AEB decreased mood instability and demonstration of stable mood    -01/05/16: Pt presents with pressured speech, labile mood, and grandiosity.    -01/08/16: Pt presents with calmer affect, more stable mood, and coherent thoughts; MD feels that Pt's symptoms have decreased to the point that they can be managed in an outpatient setting.   Attendees:  Patient:    Family:    Physician: Dr. Parke Poisson, MD  01/08/2016 2:58 PM  Nursing: Mayra Neer, RN; Larrie Kass., RN 01/08/2016 2:58 PM  Clinical Social Worker Peri Maris, [place] 01/08/2016 2:58 PM  Other: Tilden Fossa, LCSWA 01/08/2016 2:58 PM  Clinical: Lars Pinks, RN Case manager  01/08/2016 2:58 PM  Other:  01/08/2016 2:58 PM  Other:     Peri Maris, [place] Social [facility] [phone]

## 2016-01-08 NOTE — Progress Notes (Signed)
Adult Psychoeducational Group Note  Date:  01/08/2016 Time:  11:37 AM  Group Topic/Focus:  Goals Group:   The focus of this group is to help patients establish daily goals to achieve during treatment and discuss how the patient can incorporate goal setting into their daily lives to aide in recovery.   Participation Level:  Active  Participation Quality:  Appropriate  Affect:  Appropriate  Cognitive:  Appropriate  Insight: Appropriate  Engagement in Group:  Engaged  Modes of Intervention:  Discussion  Additional Comments:  Pt participated in group today.  Pt states that he wants to set limits for himself and take what he has learned in this facility and apply it to everyday life. Joanann Mies R Iley Deignan 01/08/2016, 11:37 AM

## 2016-01-08 NOTE — [facility] Suicide Risk Assessment (Signed)
[facility] INPATIENT:  Family/Significant Other Suicide Prevention Education  Suicide Prevention Education:  Contact Attempts: April MansonJudith Lust, Pt's mother [phone], has been identified by the patient as the family member/significant other with whom the patient will be residing, and identified as the person(s) who will aid the patient in the event of a mental health crisis.  With written consent from the patient, two attempts were made to provide suicide prevention education, prior to and/or following the patient's discharge.  We were unsuccessful in providing suicide prevention education.  A suicide education pamphlet was given to the patient to share with family/significant other.  Date and time of first attempt:01/08/2016 @ 2:30pm Date and time of second attempt: 01/08/2016 @ 3:00pm  Brent Rios 01/08/2016, 2:57 PM

## 2016-01-08 NOTE — Progress Notes (Signed)
Patient ID: Brent HorsfallSeth Klemmer, male   DOB: 11/29/1985, 30 y.o.   MRN: [id number] D: Client up later in this shift, seen in dayroom interacting appropriately. Client reports he slept with the medications given earlier in the day, reports he was upset because he didn't get to go home. "I need to go help take care of my sister, she had surgery" Client accepting that he will leave tomorrow. A: Writer provided emotional support. Medication reviewed, administered as ordered. Staff will monitor q6515min for safety. R: Client is safe on the unit.

## 2016-01-09 LAB — URINE CULTURE: Culture: NO GROWTH
